# Patient Record
Sex: Male | Born: 1975
Health system: Southern US, Community
[De-identification: ages and names within clinical notes are randomized; demographics above are authoritative.]

## PROBLEM LIST (undated history)

## (undated) DIAGNOSIS — G35 Multiple sclerosis: Secondary | ICD-10-CM

## (undated) DIAGNOSIS — E669 Obesity, unspecified: Secondary | ICD-10-CM

## (undated) HISTORY — DX: Obesity, unspecified: E66.9

---

## 2011-06-17 ENCOUNTER — Encounter: Payer: Self-pay | Admitting: Family Medicine

## 2016-12-16 DIAGNOSIS — Z0000 Encounter for general adult medical examination without abnormal findings: Secondary | ICD-10-CM | POA: Diagnosis not present

## 2016-12-16 DIAGNOSIS — Z13228 Encounter for screening for other metabolic disorders: Secondary | ICD-10-CM | POA: Diagnosis not present

## 2016-12-16 DIAGNOSIS — Z79899 Other long term (current) drug therapy: Secondary | ICD-10-CM | POA: Diagnosis not present

## 2016-12-16 DIAGNOSIS — Z23 Encounter for immunization: Secondary | ICD-10-CM | POA: Diagnosis not present

## 2016-12-16 DIAGNOSIS — Z13220 Encounter for screening for lipoid disorders: Secondary | ICD-10-CM | POA: Diagnosis not present

## 2016-12-26 DIAGNOSIS — Z3009 Encounter for other general counseling and advice on contraception: Secondary | ICD-10-CM | POA: Diagnosis not present

## 2016-12-26 DIAGNOSIS — Z302 Encounter for sterilization: Secondary | ICD-10-CM | POA: Diagnosis not present

## 2017-01-02 DIAGNOSIS — Z9889 Other specified postprocedural states: Secondary | ICD-10-CM | POA: Diagnosis not present

## 2017-06-15 DIAGNOSIS — E559 Vitamin D deficiency, unspecified: Secondary | ICD-10-CM | POA: Diagnosis not present

## 2017-06-15 DIAGNOSIS — R5383 Other fatigue: Secondary | ICD-10-CM | POA: Diagnosis not present

## 2017-06-15 DIAGNOSIS — G35 Multiple sclerosis: Secondary | ICD-10-CM | POA: Diagnosis not present

## 2017-08-09 DIAGNOSIS — G35 Multiple sclerosis: Secondary | ICD-10-CM | POA: Diagnosis not present

## 2017-11-16 DIAGNOSIS — F064 Anxiety disorder due to known physiological condition: Secondary | ICD-10-CM | POA: Diagnosis not present

## 2017-11-20 DIAGNOSIS — N529 Male erectile dysfunction, unspecified: Secondary | ICD-10-CM | POA: Diagnosis not present

## 2017-12-05 DIAGNOSIS — F064 Anxiety disorder due to known physiological condition: Secondary | ICD-10-CM | POA: Diagnosis not present

## 2018-01-11 DIAGNOSIS — F4323 Adjustment disorder with mixed anxiety and depressed mood: Secondary | ICD-10-CM | POA: Diagnosis not present

## 2018-01-18 DIAGNOSIS — F4323 Adjustment disorder with mixed anxiety and depressed mood: Secondary | ICD-10-CM | POA: Diagnosis not present

## 2018-03-08 DIAGNOSIS — F4323 Adjustment disorder with mixed anxiety and depressed mood: Secondary | ICD-10-CM | POA: Diagnosis not present

## 2018-07-19 DIAGNOSIS — G35 Multiple sclerosis: Secondary | ICD-10-CM | POA: Diagnosis not present

## 2018-07-19 DIAGNOSIS — R5383 Other fatigue: Secondary | ICD-10-CM | POA: Diagnosis not present

## 2018-07-19 DIAGNOSIS — E559 Vitamin D deficiency, unspecified: Secondary | ICD-10-CM | POA: Diagnosis not present

## 2018-07-19 DIAGNOSIS — R4189 Other symptoms and signs involving cognitive functions and awareness: Secondary | ICD-10-CM | POA: Diagnosis not present

## 2018-07-20 DIAGNOSIS — G35 Multiple sclerosis: Secondary | ICD-10-CM | POA: Diagnosis not present

## 2018-07-20 DIAGNOSIS — Z6839 Body mass index (BMI) 39.0-39.9, adult: Secondary | ICD-10-CM | POA: Diagnosis not present

## 2018-08-08 DIAGNOSIS — G35 Multiple sclerosis: Secondary | ICD-10-CM | POA: Diagnosis not present

## 2018-08-31 DIAGNOSIS — G35 Multiple sclerosis: Secondary | ICD-10-CM | POA: Diagnosis not present

## 2018-08-31 DIAGNOSIS — E559 Vitamin D deficiency, unspecified: Secondary | ICD-10-CM | POA: Diagnosis not present

## 2018-08-31 DIAGNOSIS — R209 Unspecified disturbances of skin sensation: Secondary | ICD-10-CM | POA: Diagnosis not present

## 2018-08-31 DIAGNOSIS — M25521 Pain in right elbow: Secondary | ICD-10-CM | POA: Diagnosis not present

## 2018-09-17 DIAGNOSIS — G5781 Other specified mononeuropathies of right lower limb: Secondary | ICD-10-CM | POA: Diagnosis not present

## 2018-09-17 DIAGNOSIS — M79671 Pain in right foot: Secondary | ICD-10-CM | POA: Diagnosis not present

## 2018-10-11 DIAGNOSIS — M7711 Lateral epicondylitis, right elbow: Secondary | ICD-10-CM | POA: Diagnosis not present

## 2018-10-25 DIAGNOSIS — Z125 Encounter for screening for malignant neoplasm of prostate: Secondary | ICD-10-CM | POA: Diagnosis not present

## 2018-10-25 DIAGNOSIS — Z113 Encounter for screening for infections with a predominantly sexual mode of transmission: Secondary | ICD-10-CM | POA: Diagnosis not present

## 2018-10-25 DIAGNOSIS — Z136 Encounter for screening for cardiovascular disorders: Secondary | ICD-10-CM | POA: Diagnosis not present

## 2018-10-25 DIAGNOSIS — Z0000 Encounter for general adult medical examination without abnormal findings: Secondary | ICD-10-CM | POA: Diagnosis not present

## 2018-10-31 DIAGNOSIS — N529 Male erectile dysfunction, unspecified: Secondary | ICD-10-CM | POA: Diagnosis not present

## 2018-12-18 DIAGNOSIS — L309 Dermatitis, unspecified: Secondary | ICD-10-CM | POA: Diagnosis not present

## 2018-12-18 DIAGNOSIS — L508 Other urticaria: Secondary | ICD-10-CM | POA: Diagnosis not present

## 2019-09-10 DIAGNOSIS — G35 Multiple sclerosis: Secondary | ICD-10-CM | POA: Diagnosis not present

## 2019-09-10 DIAGNOSIS — Z125 Encounter for screening for malignant neoplasm of prostate: Secondary | ICD-10-CM | POA: Diagnosis not present

## 2019-09-10 DIAGNOSIS — Z113 Encounter for screening for infections with a predominantly sexual mode of transmission: Secondary | ICD-10-CM | POA: Diagnosis not present

## 2019-09-11 DIAGNOSIS — G35 Multiple sclerosis: Secondary | ICD-10-CM | POA: Diagnosis not present

## 2019-09-23 DIAGNOSIS — G35 Multiple sclerosis: Secondary | ICD-10-CM | POA: Diagnosis not present

## 2019-10-04 DIAGNOSIS — G35 Multiple sclerosis: Secondary | ICD-10-CM | POA: Diagnosis not present

## 2019-11-04 DIAGNOSIS — N529 Male erectile dysfunction, unspecified: Secondary | ICD-10-CM | POA: Diagnosis not present

## 2019-12-29 ENCOUNTER — Emergency Department (HOSPITAL_COMMUNITY)
Admission: EM | Admit: 2019-12-29 | Discharge: 2019-12-29 | Disposition: A | Discharge status: Home / Self Care | Payer: BC Managed Care – PPO | Attending: Emergency Medicine | Admitting: Emergency Medicine

## 2019-12-29 ENCOUNTER — Encounter (HOSPITAL_COMMUNITY): Payer: Self-pay | Admitting: Emergency Medicine

## 2019-12-29 ENCOUNTER — Other Ambulatory Visit: Payer: Self-pay

## 2019-12-29 DIAGNOSIS — G35 Multiple sclerosis: Secondary | ICD-10-CM | POA: Diagnosis not present

## 2019-12-29 DIAGNOSIS — L0291 Cutaneous abscess, unspecified: Secondary | ICD-10-CM

## 2019-12-29 DIAGNOSIS — L02415 Cutaneous abscess of right lower limb: Secondary | ICD-10-CM | POA: Insufficient documentation

## 2019-12-29 DIAGNOSIS — L989 Disorder of the skin and subcutaneous tissue, unspecified: Secondary | ICD-10-CM | POA: Diagnosis not present

## 2019-12-29 DIAGNOSIS — F17290 Nicotine dependence, other tobacco product, uncomplicated: Secondary | ICD-10-CM | POA: Insufficient documentation

## 2019-12-29 HISTORY — DX: Multiple sclerosis: G35

## 2019-12-29 MED ORDER — LIDOCAINE HCL (PF) 1 % IJ SOLN
5.0000 mL | Freq: Once | INTRAMUSCULAR | Status: AC
  Administered 2019-12-29: 5 mL
  Filled 2019-12-29: qty 5

## 2019-12-29 MED ORDER — SULFAMETHOXAZOLE-TRIMETHOPRIM 800-160 MG PO TABS: 1.0000 | ORAL_TABLET | Freq: Two times a day (BID) | ORAL | 0 refills | Status: AC

## 2019-12-29 NOTE — Discharge Instructions (Signed)
Take antibiotics as prescribed and complete the full course. Apply warm compresses to your leg for 20 minutes at a time at least 3 times daily. Recommend recheck in 2 days for packing removal.  This can be done at your PCP office, urgent care, or return to ER if needed.

## 2019-12-29 NOTE — ED Provider Notes (Signed)
[facility] EMERGENCY DEPARTMENT Provider Note   CSN: [id number] Arrival date & time: 12/29/19  1336     History Chief Complaint  Patient presents with  . Insect Bite    Alejandro Crawford is a 44 y.o. male.  44 year old male with past medical history of MS presents with complaint of right posterior lower leg wound.  Patient states that he was walking near a pond 1 week ago, did not feel anything bite his leg but thinks he may have been bitten by something as he noticed a bump on the back of his leg afterwards.  Patient tried to squeeze this area without any drainage, reports worsening pain, swelling, redness in the area since that time with drainage.  No other complaints or concerns today.        Past Medical History:  Diagnosis Date  . MS (multiple sclerosis) ([place])   . Obesity   . Vitamin D deficiency     There are no problems to display for this patient.   History reviewed. No pertinent surgical history.     No family history on file.  Social History   Tobacco Use  . Smoking status: Current Every Day Smoker    Types: Cigars  . Smokeless tobacco: Never Used  Substance Use Topics  . Alcohol use: Yes  . Drug use: Never    Home Medications Prior to Admission medications   Medication Sig Start Date End Date Taking? Authorizing Provider  sulfamethoxazole-trimethoprim (BACTRIM DS) 800-160 MG tablet Take 1 tablet by mouth 2 (two) times daily for 10 days. 12/29/19 01/08/20  Tacy Learn, PA-C    Allergies    Patient has no known allergies.  Review of Systems   Review of Systems  Constitutional: Negative for fever.  Musculoskeletal: Positive for myalgias.  Skin: Positive for color change. Negative for rash and wound.  Allergic/Immunologic: Negative for immunocompromised state.  Neurological: Negative for weakness.  Hematological: Negative for adenopathy.  Psychiatric/Behavioral: Negative for confusion.  All other systems reviewed and are  negative.   Physical Exam Updated Vital Signs BP (!) 156/91 (BP Location: Left Wrist)   Pulse 86   Temp 98.7 F (37.1 C) (Oral)   Resp 18   SpO2 100%   Physical Exam Vitals and nursing note reviewed.  Constitutional:      General: He is not in acute distress.    Appearance: He is well-developed. He is not diaphoretic.  HENT:     Head: Normocephalic and atraumatic.  Cardiovascular:     Pulses: Normal pulses.  Pulmonary:     Effort: Pulmonary effort is normal.  Musculoskeletal:     Right lower leg: No edema.     Left lower leg: No edema.       Legs:  Skin:    General: Skin is warm and dry.     Findings: Erythema present.  Neurological:     Mental Status: He is alert and oriented to person, place, and time.  Psychiatric:        Behavior: Behavior normal.     ED Results / Procedures / Treatments   Labs (all labs ordered are listed, but only abnormal results are displayed) Labs Reviewed - No data to display  EKG None  Radiology No results found.  Procedures .Marland KitchenIncision and Drainage  Date/Time: 12/29/2019 3:36 PM Performed by: Tacy Learn, PA-C Authorized by: Tacy Learn, PA-C   Consent:    Consent obtained:  Verbal   Consent given by:  Patient   Risks discussed:  Bleeding, incomplete drainage, pain and damage to other organs   Alternatives discussed:  No treatment Universal protocol:    Procedure explained and questions answered to patient or proxy's satisfaction: yes     Relevant documents present and verified: yes     Test results available and properly labeled: yes     Imaging studies available: yes     Required blood products, implants, devices, and special equipment available: yes     Site/side marked: yes     Immediately prior to procedure a time out was called: yes     Patient identity confirmed:  Verbally with patient Location:    Type:  Abscess   Size:  2cm x 2cm    Location:  Lower extremity   Lower extremity location:  Leg   Leg  location:  R lower leg Pre-procedure details:    Skin preparation:  Betadine Anesthesia (see MAR for exact dosages):    Anesthesia method:  Local infiltration   Local anesthetic:  Lidocaine 1% w/o epi Procedure type:    Complexity:  Complex Procedure details:    Incision types:  Single straight   Incision depth:  Subcutaneous   Scalpel blade:  11   Wound management:  Probed and deloculated, irrigated with saline and extensive cleaning   Drainage:  Purulent   Drainage amount:  Moderate   Wound treatment:  Wound left open   Packing materials:  1/4 in iodoform gauze   Amount 1/4" iodoform:  2" Post-procedure details:    Patient tolerance of procedure:  Tolerated well, no immediate complications   (including critical care time)  Medications Ordered in ED Medications  lidocaine (PF) (XYLOCAINE) 1 % injection 5 mL (5 mLs Infiltration Given by Other 12/29/19 1530)    ED Course  I have reviewed the triage vital signs and the nursing notes.  Pertinent labs & imaging results that were available during my care of the patient were reviewed by me and considered in my medical decision making (see chart for details).  Clinical Course as of Dec 29 1655  Wynelle Link Dec 29, 2019  1656 43yo male with abscess to posterior to right lower leg, I&D in the ER, packing placed and pt dc on antibiotics. Recommend warm compresses, PCP recheck in 2 days for packing removal. Return to ER for new or worsening symptoms.    [LM]    Clinical Course User Index [LM] Alden Hipp   MDM Rules/Calculators/A&P                      Final Clinical Impression(s) / ED Diagnoses Final diagnoses:  Abscess    Rx / DC Orders ED Discharge Orders         Ordered    sulfamethoxazole-trimethoprim (BACTRIM DS) 800-160 MG tablet  2 times daily     12/29/19 1509           Jeannie Fend, PA-C 12/29/19 1657    Lorre Nick, MD 12/31/19 [phone]

## 2019-12-29 NOTE — ED Notes (Signed)
Patient verbalizes understanding of discharge instructions. Opportunity for questioning and answers were provided. Armband removed by staff, pt discharged from ED.  

## 2019-12-29 NOTE — ED Triage Notes (Signed)
C/o insect bite to posterior lower R leg since Monday with swelling and drainage.  Reports chills yesterday.

## 2020-01-01 DIAGNOSIS — L02419 Cutaneous abscess of limb, unspecified: Secondary | ICD-10-CM | POA: Diagnosis not present

## 2020-01-14 DIAGNOSIS — L02419 Cutaneous abscess of limb, unspecified: Secondary | ICD-10-CM | POA: Diagnosis not present

## 2020-02-27 ENCOUNTER — Ambulatory Visit: Payer: BC Managed Care – PPO | Attending: Internal Medicine

## 2020-02-27 DIAGNOSIS — Z23 Encounter for immunization: Secondary | ICD-10-CM

## 2020-02-27 NOTE — Progress Notes (Signed)
   Covid-19 Vaccination Clinic  Name:  [name]    MRN: [id number] DOB: August 10, 1976  02/27/2020  Mr. Tedesco was observed post Covid-19 immunization for 15 minutes without incident. He was provided with Vaccine Information Sheet and instruction to access the V-Safe system.   Mr. Barner was instructed to call 911 with any severe reactions post vaccine: Marland Kitchen Difficulty breathing  . Swelling of face and throat  . A fast heartbeat  . A bad rash all over body  . Dizziness and weakness   Immunizations Administered    Name Date Dose VIS Date Route   Pfizer COVID-19 Vaccine 02/27/2020  8:58 AM 0.3 mL 11/01/2019 Intramuscular   Manufacturer: [employer], [employer]   Lot: [id number]   NDC: [address]-7

## 2020-03-23 ENCOUNTER — Ambulatory Visit: Payer: BC Managed Care – PPO | Attending: Internal Medicine

## 2020-03-23 DIAGNOSIS — Z23 Encounter for immunization: Secondary | ICD-10-CM

## 2020-03-23 NOTE — Progress Notes (Signed)
   Covid-19 Vaccination Clinic  Name:  Aemon Koeller    MRN: [id number] DOB: Jul 10, 1976  03/23/2020  Mr. Watkinson was observed post Covid-19 immunization for 15 minutes without incident. He was provided with Vaccine Information Sheet and instruction to access the V-Safe system.   Mr. Trusty was instructed to call 911 with any severe reactions post vaccine: Marland Kitchen Difficulty breathing  . Swelling of face and throat  . A fast heartbeat  . A bad rash all over body  . Dizziness and weakness   Immunizations Administered    Name Date Dose VIS Date Route   Pfizer COVID-19 Vaccine 03/23/2020  2:25 PM 0.3 mL 01/15/2019 Intramuscular   Manufacturer: [employer], [employer]   Lot: [id number]   NDC: [address]-3

## 2020-04-16 DIAGNOSIS — H6092 Unspecified otitis externa, left ear: Secondary | ICD-10-CM | POA: Diagnosis not present

## 2020-08-18 DIAGNOSIS — F4320 Adjustment disorder, unspecified: Secondary | ICD-10-CM | POA: Diagnosis not present

## 2020-08-25 DIAGNOSIS — F4320 Adjustment disorder, unspecified: Secondary | ICD-10-CM | POA: Diagnosis not present

## 2020-09-01 DIAGNOSIS — F4320 Adjustment disorder, unspecified: Secondary | ICD-10-CM | POA: Diagnosis not present

## 2020-09-04 DIAGNOSIS — G35 Multiple sclerosis: Secondary | ICD-10-CM | POA: Diagnosis not present

## 2020-09-04 DIAGNOSIS — R5383 Other fatigue: Secondary | ICD-10-CM | POA: Diagnosis not present

## 2020-09-04 DIAGNOSIS — E538 Deficiency of other specified B group vitamins: Secondary | ICD-10-CM | POA: Diagnosis not present

## 2020-09-04 DIAGNOSIS — E559 Vitamin D deficiency, unspecified: Secondary | ICD-10-CM | POA: Diagnosis not present

## 2020-09-08 DIAGNOSIS — F4320 Adjustment disorder, unspecified: Secondary | ICD-10-CM | POA: Diagnosis not present

## 2020-09-15 DIAGNOSIS — M47812 Spondylosis without myelopathy or radiculopathy, cervical region: Secondary | ICD-10-CM | POA: Diagnosis not present

## 2020-09-15 DIAGNOSIS — Q283 Other malformations of cerebral vessels: Secondary | ICD-10-CM | POA: Diagnosis not present

## 2020-09-15 DIAGNOSIS — M47814 Spondylosis without myelopathy or radiculopathy, thoracic region: Secondary | ICD-10-CM | POA: Diagnosis not present

## 2020-09-15 DIAGNOSIS — F4320 Adjustment disorder, unspecified: Secondary | ICD-10-CM | POA: Diagnosis not present

## 2020-09-15 DIAGNOSIS — R9082 White matter disease, unspecified: Secondary | ICD-10-CM | POA: Diagnosis not present

## 2020-09-15 DIAGNOSIS — G35 Multiple sclerosis: Secondary | ICD-10-CM | POA: Diagnosis not present

## 2020-09-21 DIAGNOSIS — G35 Multiple sclerosis: Secondary | ICD-10-CM | POA: Diagnosis not present

## 2020-09-22 DIAGNOSIS — G35 Multiple sclerosis: Secondary | ICD-10-CM | POA: Diagnosis not present

## 2020-09-23 DIAGNOSIS — G35 Multiple sclerosis: Secondary | ICD-10-CM | POA: Diagnosis not present

## 2020-09-24 DIAGNOSIS — G35 Multiple sclerosis: Secondary | ICD-10-CM | POA: Diagnosis not present

## 2020-09-25 DIAGNOSIS — G35 Multiple sclerosis: Secondary | ICD-10-CM | POA: Diagnosis not present

## 2020-09-29 DIAGNOSIS — F4320 Adjustment disorder, unspecified: Secondary | ICD-10-CM | POA: Diagnosis not present

## 2020-10-06 DIAGNOSIS — F4320 Adjustment disorder, unspecified: Secondary | ICD-10-CM | POA: Diagnosis not present

## 2020-10-13 DIAGNOSIS — F4320 Adjustment disorder, unspecified: Secondary | ICD-10-CM | POA: Diagnosis not present

## 2020-10-20 DIAGNOSIS — F4320 Adjustment disorder, unspecified: Secondary | ICD-10-CM | POA: Diagnosis not present

## 2020-10-30 DIAGNOSIS — Z0000 Encounter for general adult medical examination without abnormal findings: Secondary | ICD-10-CM | POA: Diagnosis not present

## 2020-11-02 DIAGNOSIS — N529 Male erectile dysfunction, unspecified: Secondary | ICD-10-CM | POA: Diagnosis not present

## 2020-11-03 DIAGNOSIS — F4320 Adjustment disorder, unspecified: Secondary | ICD-10-CM | POA: Diagnosis not present

## 2020-11-17 DIAGNOSIS — Z13220 Encounter for screening for lipoid disorders: Secondary | ICD-10-CM | POA: Diagnosis not present

## 2020-11-17 DIAGNOSIS — Z0000 Encounter for general adult medical examination without abnormal findings: Secondary | ICD-10-CM | POA: Diagnosis not present

## 2020-11-17 DIAGNOSIS — G35 Multiple sclerosis: Secondary | ICD-10-CM | POA: Diagnosis not present

## 2020-11-17 DIAGNOSIS — Z113 Encounter for screening for infections with a predominantly sexual mode of transmission: Secondary | ICD-10-CM | POA: Diagnosis not present

## 2020-11-17 DIAGNOSIS — Z125 Encounter for screening for malignant neoplasm of prostate: Secondary | ICD-10-CM | POA: Diagnosis not present

## 2020-11-24 DIAGNOSIS — F4322 Adjustment disorder with anxiety: Secondary | ICD-10-CM | POA: Diagnosis not present

## 2020-12-01 DIAGNOSIS — F4322 Adjustment disorder with anxiety: Secondary | ICD-10-CM | POA: Diagnosis not present

## 2020-12-15 DIAGNOSIS — F4322 Adjustment disorder with anxiety: Secondary | ICD-10-CM | POA: Diagnosis not present

## 2021-01-13 DIAGNOSIS — F411 Generalized anxiety disorder: Secondary | ICD-10-CM | POA: Diagnosis not present

## 2021-02-10 DIAGNOSIS — F411 Generalized anxiety disorder: Secondary | ICD-10-CM | POA: Diagnosis not present

## 2021-03-17 DIAGNOSIS — F411 Generalized anxiety disorder: Secondary | ICD-10-CM | POA: Diagnosis not present

## 2021-04-22 DIAGNOSIS — F411 Generalized anxiety disorder: Secondary | ICD-10-CM | POA: Diagnosis not present

## 2021-05-27 DIAGNOSIS — F411 Generalized anxiety disorder: Secondary | ICD-10-CM | POA: Diagnosis not present

## 2021-07-28 DIAGNOSIS — F411 Generalized anxiety disorder: Secondary | ICD-10-CM | POA: Diagnosis not present

## 2021-08-25 DIAGNOSIS — F411 Generalized anxiety disorder: Secondary | ICD-10-CM | POA: Diagnosis not present

## 2021-10-04 DIAGNOSIS — J029 Acute pharyngitis, unspecified: Secondary | ICD-10-CM | POA: Diagnosis not present

## 2021-11-17 DIAGNOSIS — F4320 Adjustment disorder, unspecified: Secondary | ICD-10-CM | POA: Diagnosis not present

## 2021-11-24 DIAGNOSIS — E559 Vitamin D deficiency, unspecified: Secondary | ICD-10-CM | POA: Diagnosis not present

## 2021-11-24 DIAGNOSIS — G35 Multiple sclerosis: Secondary | ICD-10-CM | POA: Diagnosis not present

## 2021-11-24 DIAGNOSIS — E538 Deficiency of other specified B group vitamins: Secondary | ICD-10-CM | POA: Diagnosis not present

## 2021-12-01 DIAGNOSIS — F4320 Adjustment disorder, unspecified: Secondary | ICD-10-CM | POA: Diagnosis not present

## 2021-12-10 DIAGNOSIS — Z6841 Body Mass Index (BMI) 40.0 and over, adult: Secondary | ICD-10-CM | POA: Diagnosis not present

## 2021-12-10 DIAGNOSIS — Z13220 Encounter for screening for lipoid disorders: Secondary | ICD-10-CM | POA: Diagnosis not present

## 2021-12-10 DIAGNOSIS — G35 Multiple sclerosis: Secondary | ICD-10-CM | POA: Diagnosis not present

## 2021-12-10 DIAGNOSIS — Z0000 Encounter for general adult medical examination without abnormal findings: Secondary | ICD-10-CM | POA: Diagnosis not present

## 2021-12-10 DIAGNOSIS — Z113 Encounter for screening for infections with a predominantly sexual mode of transmission: Secondary | ICD-10-CM | POA: Diagnosis not present

## 2021-12-10 DIAGNOSIS — Z125 Encounter for screening for malignant neoplasm of prostate: Secondary | ICD-10-CM | POA: Diagnosis not present

## 2021-12-13 DIAGNOSIS — G35 Multiple sclerosis: Secondary | ICD-10-CM | POA: Diagnosis not present

## 2021-12-24 DIAGNOSIS — B1920 Unspecified viral hepatitis C without hepatic coma: Secondary | ICD-10-CM | POA: Diagnosis not present

## 2021-12-29 DIAGNOSIS — F4322 Adjustment disorder with anxiety: Secondary | ICD-10-CM | POA: Diagnosis not present

## 2022-01-09 ENCOUNTER — Encounter (HOSPITAL_COMMUNITY): Payer: Self-pay

## 2022-01-09 ENCOUNTER — Other Ambulatory Visit: Payer: Self-pay

## 2022-01-09 ENCOUNTER — Emergency Department (HOSPITAL_COMMUNITY): Payer: BC Managed Care – PPO

## 2022-01-09 ENCOUNTER — Inpatient Hospital Stay (HOSPITAL_COMMUNITY)
Admission: EM | Admit: 2022-01-09 | Discharge: 2022-01-12 | DRG: 418 | Disposition: A | Discharge status: Home / Self Care | Payer: BC Managed Care – PPO | Attending: Surgery | Admitting: Surgery

## 2022-01-09 DIAGNOSIS — I96 Gangrene, not elsewhere classified: Secondary | ICD-10-CM | POA: Diagnosis present

## 2022-01-09 DIAGNOSIS — F17290 Nicotine dependence, other tobacco product, uncomplicated: Secondary | ICD-10-CM | POA: Diagnosis present

## 2022-01-09 DIAGNOSIS — K8010 Calculus of gallbladder with chronic cholecystitis without obstruction: Secondary | ICD-10-CM | POA: Diagnosis present

## 2022-01-09 DIAGNOSIS — K8066 Calculus of gallbladder and bile duct with acute and chronic cholecystitis without obstruction: Secondary | ICD-10-CM | POA: Diagnosis not present

## 2022-01-09 DIAGNOSIS — K760 Fatty (change of) liver, not elsewhere classified: Secondary | ICD-10-CM | POA: Diagnosis not present

## 2022-01-09 DIAGNOSIS — E559 Vitamin D deficiency, unspecified: Secondary | ICD-10-CM | POA: Diagnosis not present

## 2022-01-09 DIAGNOSIS — G35 Multiple sclerosis: Secondary | ICD-10-CM | POA: Diagnosis present

## 2022-01-09 DIAGNOSIS — K810 Acute cholecystitis: Secondary | ICD-10-CM | POA: Diagnosis present

## 2022-01-09 DIAGNOSIS — R1110 Vomiting, unspecified: Secondary | ICD-10-CM | POA: Diagnosis not present

## 2022-01-09 DIAGNOSIS — K660 Peritoneal adhesions (postprocedural) (postinfection): Secondary | ICD-10-CM | POA: Diagnosis present

## 2022-01-09 DIAGNOSIS — K8000 Calculus of gallbladder with acute cholecystitis without obstruction: Secondary | ICD-10-CM | POA: Diagnosis not present

## 2022-01-09 DIAGNOSIS — E669 Obesity, unspecified: Secondary | ICD-10-CM | POA: Diagnosis not present

## 2022-01-09 DIAGNOSIS — K8012 Calculus of gallbladder with acute and chronic cholecystitis without obstruction: Secondary | ICD-10-CM | POA: Diagnosis not present

## 2022-01-09 DIAGNOSIS — K8001 Calculus of gallbladder with acute cholecystitis with obstruction: Secondary | ICD-10-CM | POA: Diagnosis not present

## 2022-01-09 DIAGNOSIS — Z20822 Contact with and (suspected) exposure to covid-19: Secondary | ICD-10-CM | POA: Diagnosis present

## 2022-01-09 DIAGNOSIS — Z6837 Body mass index (BMI) 37.0-37.9, adult: Secondary | ICD-10-CM | POA: Diagnosis not present

## 2022-01-09 DIAGNOSIS — R109 Unspecified abdominal pain: Secondary | ICD-10-CM | POA: Diagnosis not present

## 2022-01-09 LAB — URINALYSIS, ROUTINE W REFLEX MICROSCOPIC
Bilirubin Urine: NEGATIVE
Glucose, UA: NEGATIVE mg/dL
Hgb urine dipstick: NEGATIVE
Ketones, ur: NEGATIVE mg/dL
Leukocytes,Ua: NEGATIVE
Nitrite: NEGATIVE
Protein, ur: NEGATIVE mg/dL
Specific Gravity, Urine: 1.044 — ABNORMAL HIGH (ref 1.005–1.030)
pH: 5 (ref 5.0–8.0)

## 2022-01-09 LAB — RESP PANEL BY RT-PCR (FLU A&B, COVID) ARPGX2
Influenza A by PCR: NEGATIVE
Influenza B by PCR: NEGATIVE
SARS Coronavirus 2 by RT PCR: NEGATIVE

## 2022-01-09 LAB — COMPREHENSIVE METABOLIC PANEL
ALT: 31 U/L (ref 0–44)
AST: 31 U/L (ref 15–41)
Albumin: 4.2 g/dL (ref 3.5–5.0)
Alkaline Phosphatase: 52 U/L (ref 38–126)
Anion gap: 10 (ref 5–15)
BUN: 13 mg/dL (ref 6–20)
CO2: 20 mmol/L — ABNORMAL LOW (ref 22–32)
Calcium: 8.7 mg/dL — ABNORMAL LOW (ref 8.9–10.3)
Chloride: 104 mmol/L (ref 98–111)
Creatinine, Ser: 1 mg/dL (ref 0.61–1.24)
GFR, Estimated: >60 mL/min (ref >60)
Glucose, Bld: 127 mg/dL — ABNORMAL HIGH (ref 70–99)
Potassium: 3.9 mmol/L (ref 3.5–5.1)
Sodium: 134 mmol/L — ABNORMAL LOW (ref 135–145)
Total Bilirubin: 1 mg/dL (ref 0.3–1.2)
Total Protein: 7.8 g/dL (ref 6.5–8.1)

## 2022-01-09 LAB — CBC
HCT: 46.5 % (ref 39.0–52.0)
Hemoglobin: 14.9 g/dL (ref 13.0–17.0)
MCH: 31.2 pg (ref 26.0–34.0)
MCHC: 32 g/dL (ref 30.0–36.0)
MCV: 97.5 fL (ref 80.0–100.0)
Platelets: 182 10*3/uL (ref 150–400)
RBC: 4.77 MIL/uL (ref 4.22–5.81)
RDW: 12 % (ref 11.5–15.5)
WBC: 12.9 10*3/uL — ABNORMAL HIGH (ref 4.0–10.5)
nRBC: 0 % (ref 0.0–0.2)

## 2022-01-09 LAB — LIPASE, BLOOD: Lipase: 32 U/L (ref 11–51)

## 2022-01-09 MED ORDER — FENTANYL CITRATE PF 50 MCG/ML IJ SOSY
50.0000 ug | PREFILLED_SYRINGE | Freq: Once | INTRAMUSCULAR | Status: AC
  Administered 2022-01-09: 50 ug via INTRAVENOUS
  Filled 2022-01-09: qty 1

## 2022-01-09 MED ORDER — SIMETHICONE 80 MG PO CHEW
40.0000 mg | CHEWABLE_TABLET | Freq: Four times a day (QID) | ORAL | Status: DC | PRN
  Filled 2022-01-09: qty 1

## 2022-01-09 MED ORDER — SODIUM CHLORIDE 0.9 % IV BOLUS
1000.0000 mL | Freq: Once | INTRAVENOUS | Status: AC
  Administered 2022-01-09: 1000 mL via INTRAVENOUS

## 2022-01-09 MED ORDER — MORPHINE SULFATE (PF) 4 MG/ML IV SOLN
4.0000 mg | Freq: Once | INTRAVENOUS | Status: AC
  Administered 2022-01-09: 4 mg via INTRAVENOUS
  Filled 2022-01-09: qty 1

## 2022-01-09 MED ORDER — DIPHENHYDRAMINE HCL 50 MG/ML IJ SOLN: 25.0000 mg | Freq: Four times a day (QID) | INTRAMUSCULAR | Status: DC | PRN

## 2022-01-09 MED ORDER — SODIUM CHLORIDE 0.9 % IV SOLN: Freq: Once | INTRAVENOUS | Status: AC

## 2022-01-09 MED ORDER — ONDANSETRON 4 MG PO TBDP
4.0000 mg | ORAL_TABLET | Freq: Four times a day (QID) | ORAL | Status: DC | PRN
Start: 2022-01-09 — End: 2022-01-12

## 2022-01-09 MED ORDER — KCL IN DEXTROSE-NACL 20-5-0.45 MEQ/L-%-% IV SOLN
INTRAVENOUS | Status: DC
  Filled 2022-01-09: qty 1000

## 2022-01-09 MED ORDER — ONDANSETRON HCL 4 MG/2ML IJ SOLN
4.0000 mg | Freq: Once | INTRAMUSCULAR | Status: AC
  Administered 2022-01-09: 4 mg via INTRAVENOUS
  Filled 2022-01-09: qty 2

## 2022-01-09 MED ORDER — MORPHINE SULFATE (PF) 2 MG/ML IV SOLN
2.0000 mg | INTRAVENOUS | Status: DC | PRN
  Administered 2022-01-09 – 2022-01-10 (×5): 2 mg via INTRAVENOUS
  Filled 2022-01-09 (×5): qty 1

## 2022-01-09 MED ORDER — DOCUSATE SODIUM 100 MG PO CAPS
100.0000 mg | ORAL_CAPSULE | Freq: Two times a day (BID) | ORAL | Status: DC
  Administered 2022-01-09 – 2022-01-12 (×6): 100 mg via ORAL
  Filled 2022-01-09 (×6): qty 1

## 2022-01-09 MED ORDER — ONDANSETRON HCL 4 MG/2ML IJ SOLN: 4.0000 mg | Freq: Four times a day (QID) | INTRAMUSCULAR | Status: DC | PRN

## 2022-01-09 MED ORDER — SODIUM CHLORIDE 0.9 % IV SOLN
2.0000 g | INTRAVENOUS | Status: DC
  Administered 2022-01-09 – 2022-01-11 (×3): 2 g via INTRAVENOUS
  Filled 2022-01-09 (×4): qty 20

## 2022-01-09 MED ORDER — IOHEXOL 300 MG/ML  SOLN
100.0000 mL | Freq: Once | INTRAMUSCULAR | Status: AC | PRN
  Administered 2022-01-09: 100 mL via INTRAVENOUS

## 2022-01-09 MED ORDER — DIPHENHYDRAMINE HCL 25 MG PO CAPS
25.0000 mg | ORAL_CAPSULE | Freq: Four times a day (QID) | ORAL | Status: DC | PRN
Start: 2022-01-09 — End: 2022-01-12

## 2022-01-09 MED ORDER — ENOXAPARIN SODIUM 60 MG/0.6ML IJ SOSY
60.0000 mg | PREFILLED_SYRINGE | INTRAMUSCULAR | Status: DC
  Administered 2022-01-10 – 2022-01-12 (×3): 60 mg via SUBCUTANEOUS
  Filled 2022-01-09 (×5): qty 0.6

## 2022-01-09 NOTE — ED Notes (Signed)
Please call his sister with any updates.

## 2022-01-09 NOTE — ED Triage Notes (Signed)
Patient c/o N/V and upper abdominal pain. Patient states that the emesis this AM was black and yellow.

## 2022-01-09 NOTE — ED Provider Notes (Signed)
[facility] DEPT Provider Note   CSN: [id number] Arrival date & time: 01/09/22  1101     History  Chief Complaint  Patient presents with   Abdominal Pain   Emesis    Alejandro Crawford is a 46 y.o. male with a history of multiple sclerosis who presents to the emergency department complaining of upper abdominal pain, nausea and vomiting that started abruptly last night.  He initially thought that it was gas, and tried drinking hot tea, but had no relief.  He reports 4 episodes of emesis and that his emesis this morning was black and yellow.  Last bowel movement was yesterday, and he states that this was small for him, but otherwise normal.  He reports feeling fine leading up until last night.  Abdominal Pain Associated symptoms: nausea and vomiting   Associated symptoms: no chest pain, no chills, no constipation, no cough, no diarrhea, no dysuria, no fever and no shortness of breath   Emesis Associated symptoms: abdominal pain   Associated symptoms: no chills, no cough, no diarrhea and no fever       Home Medications Prior to Admission medications   Not on File      Allergies    Patient has no known allergies.    Review of Systems   Review of Systems  Constitutional:  Negative for chills and fever.  HENT:  Negative for congestion.   Respiratory:  Negative for cough and shortness of breath.   Cardiovascular:  Negative for chest pain.  Gastrointestinal:  Positive for abdominal pain, nausea and vomiting. Negative for blood in stool, constipation and diarrhea.  Genitourinary:  Negative for dysuria and flank pain.  All other systems reviewed and are negative.  Physical Exam Updated Vital Signs BP (!) 166/100 (BP Location: Left Arm)    Pulse 99    Temp 99.3 F (37.4 C) (Oral)    Resp 17    Ht 5\' 11"  (1.803 m)    Wt 122.5 kg    SpO2 100%    BMI 37.66 kg/m  Physical Exam Vitals and nursing note reviewed.  Constitutional:      Appearance: Normal  appearance.  HENT:     Head: Normocephalic and atraumatic.  Eyes:     Conjunctiva/sclera: Conjunctivae normal.  Cardiovascular:     Rate and Rhythm: Normal rate and regular rhythm.  Pulmonary:     Effort: Pulmonary effort is normal. No respiratory distress.     Breath sounds: Normal breath sounds.  Abdominal:     General: There is no distension.     Palpations: Abdomen is soft.     Tenderness: There is generalized abdominal tenderness and tenderness in the epigastric area. There is guarding. There is no right CVA tenderness, left CVA tenderness or rebound.  Skin:    General: Skin is warm and dry.  Neurological:     General: No focal deficit present.     Mental Status: He is alert.    ED Results / Procedures / Treatments   Labs (all labs ordered are listed, but only abnormal results are displayed) Labs Reviewed  COMPREHENSIVE METABOLIC PANEL - Abnormal; Notable for the following components:      Result Value   Sodium 134 (*)    CO2 20 (*)    Glucose, Bld 127 (*)    Calcium 8.7 (*)    All other components within normal limits  CBC - Abnormal; Notable for the following components:   WBC 12.9 (*)  All other components within normal limits  LIPASE, BLOOD  URINALYSIS, ROUTINE W REFLEX MICROSCOPIC    EKG None  Radiology CT ABDOMEN PELVIS W CONTRAST  Result Date: 01/09/2022 CLINICAL DATA:  46 year old male with acute abdominal and pelvic pain with vomiting. EXAM: CT ABDOMEN AND PELVIS WITH CONTRAST TECHNIQUE: Multidetector CT imaging of the abdomen and pelvis was performed using the standard protocol following bolus administration of intravenous contrast. RADIATION DOSE REDUCTION: This exam was performed according to the departmental dose-optimization program which includes automated exposure control, adjustment of the mA and/or kV according to patient size and/or use of iterative reconstruction technique. CONTRAST:  136mL OMNIPAQUE IOHEXOL 300 MG/ML  SOLN COMPARISON:  None.  FINDINGS: Lower chest: No acute abnormality. Hepatobiliary: A 3.5 cm gallstone is noted in the region of the gallbladder neck with moderate pericholecystic fluid/inflammation compatible with acute cholecystitis. Hepatic steatosis is noted No definite CBD for intrahepatic biliary dilatation identified. Pancreas: Unremarkable Spleen: Unremarkable Adrenals/Urinary Tract: The kidneys, adrenal glands and bladder are unremarkable. Stomach/Bowel: Stomach is within normal limits. Appendix appears normal. No evidence of bowel wall thickening, distention, or inflammatory changes. Vascular/Lymphatic: No significant vascular findings are present. No enlarged abdominal or pelvic lymph nodes. Reproductive: Prostate is unremarkable. Other: A trace amount free fluid within the pelvis is noted. There is no evidence of pneumoperitoneum or abscess. Musculoskeletal: No acute or suspicious bony abnormalities are noted. IMPRESSION: 1. 3.5 cm gallstone in the region of the gallbladder neck with moderate pericholecystic fluid/inflammation compatible with acute cholecystitis. No definite biliary dilatation. 2. Hepatic steatosis. Electronically Signed   By: Margarette Canada M.D.   On: 01/09/2022 14:10    Procedures Procedures    Medications Ordered in ED Medications  sodium chloride 0.9 % bolus 1,000 mL (0 mLs Intravenous Stopped 01/09/22 1257)  fentaNYL (SUBLIMAZE) injection 50 mcg (50 mcg Intravenous Given 01/09/22 1200)  ondansetron (ZOFRAN) injection 4 mg (4 mg Intravenous Given 01/09/22 1159)  iohexol (OMNIPAQUE) 300 MG/ML solution 100 mL (100 mLs Intravenous Contrast Given 01/09/22 1324)  0.9 %  sodium chloride infusion ( Intravenous New Bag/Given 01/09/22 1417)  morphine (PF) 4 MG/ML injection 4 mg (4 mg Intravenous Given 01/09/22 1417)    ED Course/ Medical Decision Making/ A&P                           Medical Decision Making Amount and/or Complexity of Data Reviewed Labs: ordered.   This patient presents to the ED for  concern of abdominal pain, this involves an extensive number of treatment options, and is a complaint that carries with it a high risk of complications and morbidity. The emergent differential diagnosis prior to evaluation includes, but is not limited to,  AAA, mesenteric ischemia, appendicitis, diverticulitis, DKA, gastritis, gastroenteritis, nephrolithiasis, pancreatitis, peritonitis, adrenal insufficiency, intestinal ischemia, constipation, UTI, SBO/LBO, splenic rupture, biliary disease, IBD, IBS, PUD, or hepatitis.  Past Medical History / Co-morbidities: Multiple sclerosis  Physical Exam: Physical exam performed. The pertinent findings include: Patient is tachycardic to 106, afebrile. Abdomen is soft with generalized tenderness to palpation, worse in epigastrium, with mild guarding.   Lab Tests: I ordered, and personally interpreted labs.  The pertinent results include:  mild leukocytosis to 12,900. Electrolytes grossly within normal limits. Lipase normal.  Liver function tests and bilirubin normal.    Imaging Studies: I ordered imaging studies including CT abdomen/pelvis. I independently visualized and interpreted imaging which showed 3.5 cm gallstone with acute cholecystitis. I agree with the radiologist interpretation.  Medications: I ordered medication including IVF, analgesics and antiemetics  for abdominal pain and nausea. Reevaluation of the patient after these medicines showed that the patient stayed the same. I have reviewed the patients home medicines and have made adjustments as needed.  Consultations Obtained: I requested consultation with the general surgeon Dr. Marcello Moores,  and discussed lab and imaging findings as well as pertinent plan - they recommend: admission for surgical intervention.    Disposition: After consideration of the diagnostic results and the patients response to treatment, I feel that patient will require admission and inpatient treatment requiring surgery. The  patient appears reasonably stabilized for admission considering the current resources, flow, and capabilities available in the ED at this time, and I doubt any other [facility] requiring further screening and/or treatment in the ED prior to admission.   Final Clinical Impression(s) / ED Diagnoses Final diagnoses:  Calculus of gallbladder with acute cholecystitis and obstruction    Rx / DC Orders ED Discharge Orders     None      Portions of this report may have been transcribed using voice recognition software. Every effort was made to ensure accuracy; however, inadvertent computerized transcription errors may be present.    Estill Cotta 01/09/22 1437    Valarie Merino, MD 01/09/22 1442

## 2022-01-09 NOTE — H&P (Signed)
CC: abd pain  Requesting provider: Dr Francia Greaves  HPI: Alejandro Crawford is an 46 y.o. male who is here for abd pain that started ~9pm last night after eating a couple small slices of pizza.  It started as back pain then progressed to subxyphoid pain, which prompted evaluation in the ED.  Past Medical History:  Diagnosis Date   MS (multiple sclerosis) ([place])    Obesity    Vitamin D deficiency     History reviewed. No pertinent surgical history.  Family History  Problem Relation Age of Onset   Hypertension Mother     Social:  reports that he has been smoking cigars. He has never used smokeless tobacco. He reports current alcohol use. He reports that he does not use drugs.  Allergies: No Known Allergies  Medications:  Avenox (Ifn Beta 1a) 84mcg IM Vit D 1260mcg q wk Tadalafin 10mg  PRN Wegovy 0.25mg  pen   Results for orders placed or performed during the hospital encounter of 01/09/22 (from the past 48 hour(s))  Lipase, blood     Status: None   Collection Time: 01/09/22 11:44 AM  Result Value Ref Range   Lipase 32 11 - 51 U/L    Comment: Performed at [facility], [place] [address], [place], [place] [address]  Comprehensive metabolic panel     Status: Abnormal   Collection Time: 01/09/22 11:44 AM  Result Value Ref Range   Sodium 134 (L) 135 - 145 mmol/L   Potassium 3.9 3.5 - 5.1 mmol/L   Chloride 104 98 - 111 mmol/L   CO2 20 (L) 22 - 32 mmol/L   Glucose, Bld 127 (H) 70 - 99 mg/dL    Comment: Glucose reference range applies only to samples taken after fasting for at least 8 hours.   BUN 13 6 - 20 mg/dL   Creatinine, Ser 1.00 0.61 - 1.24 mg/dL   Calcium 8.7 (L) 8.9 - 10.3 mg/dL   Total Protein 7.8 6.5 - 8.1 g/dL   Albumin 4.2 3.5 - 5.0 g/dL   AST 31 15 - 41 U/L   ALT 31 0 - 44 U/L   Alkaline Phosphatase 52 38 - 126 U/L   Total Bilirubin 1.0 0.3 - 1.2 mg/dL   GFR, Estimated >60 >60 mL/min    Comment: (NOTE) Calculated using the CKD-EPI Creatinine  Equation (2021)    Anion gap 10 5 - 15    Comment: Performed at [facility], [place] [address], [place], [place] [address]  CBC     Status: Abnormal   Collection Time: 01/09/22 11:44 AM  Result Value Ref Range   WBC 12.9 (H) 4.0 - 10.5 K/uL   RBC 4.77 4.22 - 5.81 MIL/uL   Hemoglobin 14.9 13.0 - 17.0 g/dL   HCT 46.5 39.0 - 52.0 %   MCV 97.5 80.0 - 100.0 fL   MCH 31.2 26.0 - 34.0 pg   MCHC 32.0 30.0 - 36.0 g/dL   RDW 12.0 11.5 - 15.5 %   Platelets 182 150 - 400 K/uL   nRBC 0.0 0.0 - 0.2 %    Comment: Performed at [facility], [place] [address], [place], [place] [address]    CT ABDOMEN PELVIS W CONTRAST  Result Date: 01/09/2022 CLINICAL DATA:  46 year old male with acute abdominal and pelvic pain with vomiting. EXAM: CT ABDOMEN AND PELVIS WITH CONTRAST TECHNIQUE: Multidetector CT imaging of the abdomen and pelvis was performed using the standard protocol following bolus administration of  intravenous contrast. RADIATION DOSE REDUCTION: This exam was performed according to the departmental dose-optimization program which includes automated exposure control, adjustment of the mA and/or kV according to patient size and/or use of iterative reconstruction technique. CONTRAST:  127mL OMNIPAQUE IOHEXOL 300 MG/ML  SOLN COMPARISON:  None. FINDINGS: Lower chest: No acute abnormality. Hepatobiliary: A 3.5 cm gallstone is noted in the region of the gallbladder neck with moderate pericholecystic fluid/inflammation compatible with acute cholecystitis. Hepatic steatosis is noted No definite CBD for intrahepatic biliary dilatation identified. Pancreas: Unremarkable Spleen: Unremarkable Adrenals/Urinary Tract: The kidneys, adrenal glands and bladder are unremarkable. Stomach/Bowel: Stomach is within normal limits. Appendix appears normal. No evidence of bowel wall thickening, distention, or inflammatory changes. Vascular/Lymphatic: No significant vascular findings are present. No  enlarged abdominal or pelvic lymph nodes. Reproductive: Prostate is unremarkable. Other: A trace amount free fluid within the pelvis is noted. There is no evidence of pneumoperitoneum or abscess. Musculoskeletal: No acute or suspicious bony abnormalities are noted. IMPRESSION: 1. 3.5 cm gallstone in the region of the gallbladder neck with moderate pericholecystic fluid/inflammation compatible with acute cholecystitis. No definite biliary dilatation. 2. Hepatic steatosis. Electronically Signed   By: Margarette Canada M.D.   On: 01/09/2022 14:10    ROS - all of the below systems have been reviewed with the patient and positives are indicated with bold text General: chills, fever or night sweats Eyes: blurry vision or double vision ENT: epistaxis or sore throat Hematologic/Lymphatic: bleeding problems, blood clots or swollen lymph nodes Endocrine: temperature intolerance or unexpected weight changes Breast: new or changing breast lumps or nipple discharge Resp: cough, shortness of breath, or wheezing CV: chest pain or dyspnea on exertion GI: as per HPI GU: dysuria, trouble voiding, or hematuria Neuro: TIA or stroke symptoms   PE Blood pressure (!) 166/100, pulse 99, temperature 99.3 F (37.4 C), temperature source Oral, resp. rate 17, height 5\' 11"  (1.803 m), weight 122.5 kg, SpO2 100 %. Constitutional: NAD; conversant; no deformities Eyes: Moist conjunctiva; no lid lag; anicteric; PERRL Neck: Trachea midline; no thyromegaly Lungs: Normal respiratory effort CV: RRR GI: Abd TTP RUQ MSK: Normal range of motion of extremities; no clubbing/cyanosis Psychiatric: Appropriate affect; alert and oriented x3  Results for orders placed or performed during the hospital encounter of 01/09/22 (from the past 48 hour(s))  Lipase, blood     Status: None   Collection Time: 01/09/22 11:44 AM  Result Value Ref Range   Lipase 32 11 - 51 U/L    Comment: Performed at [facility], [place] [address], [place], [place] [address]  Comprehensive metabolic panel     Status: Abnormal   Collection Time: 01/09/22 11:44 AM  Result Value Ref Range   Sodium 134 (L) 135 - 145 mmol/L   Potassium 3.9 3.5 - 5.1 mmol/L   Chloride 104 98 - 111 mmol/L   CO2 20 (L) 22 - 32 mmol/L   Glucose, Bld 127 (H) 70 - 99 mg/dL    Comment: Glucose reference range applies only to samples taken after fasting for at least 8 hours.   BUN 13 6 - 20 mg/dL   Creatinine, Ser 1.00 0.61 - 1.24 mg/dL   Calcium 8.7 (L) 8.9 - 10.3 mg/dL   Total Protein 7.8 6.5 - 8.1 g/dL   Albumin 4.2 3.5 - 5.0 g/dL   AST 31 15 - 41 U/L   ALT 31 0 - 44 U/L   Alkaline Phosphatase 52 38 - 126 U/L   Total Bilirubin  1.0 0.3 - 1.2 mg/dL   GFR, Estimated >60 >60 mL/min    Comment: (NOTE) Calculated using the CKD-EPI Creatinine Equation (2021)    Anion gap 10 5 - 15    Comment: Performed at [facility], [place] [address] [place], [place] [address]  CBC     Status: Abnormal   Collection Time: 01/09/22 11:44 AM  Result Value Ref Range   WBC 12.9 (H) 4.0 - 10.5 K/uL   RBC 4.77 4.22 - 5.81 MIL/uL   Hemoglobin 14.9 13.0 - 17.0 g/dL   HCT 46.5 39.0 - 52.0 %   MCV 97.5 80.0 - 100.0 fL   MCH 31.2 26.0 - 34.0 pg   MCHC 32.0 30.0 - 36.0 g/dL   RDW 12.0 11.5 - 15.5 %   Platelets 182 150 - 400 K/uL   nRBC 0.0 0.0 - 0.2 %    Comment: Performed at [facility], [place] [address] [place], [place] [address]    CT ABDOMEN PELVIS W CONTRAST  Result Date: 01/09/2022 CLINICAL DATA:  46 year old male with acute abdominal and pelvic pain with vomiting. EXAM: CT ABDOMEN AND PELVIS WITH CONTRAST TECHNIQUE: Multidetector CT imaging of the abdomen and pelvis was performed using the standard protocol following bolus administration of intravenous contrast. RADIATION DOSE REDUCTION: This exam was performed according to the departmental dose-optimization program which includes automated exposure control, adjustment of the mA and/or  kV according to patient size and/or use of iterative reconstruction technique. CONTRAST:  113mL OMNIPAQUE IOHEXOL 300 MG/ML  SOLN COMPARISON:  None. FINDINGS: Lower chest: No acute abnormality. Hepatobiliary: A 3.5 cm gallstone is noted in the region of the gallbladder neck with moderate pericholecystic fluid/inflammation compatible with acute cholecystitis. Hepatic steatosis is noted No definite CBD for intrahepatic biliary dilatation identified. Pancreas: Unremarkable Spleen: Unremarkable Adrenals/Urinary Tract: The kidneys, adrenal glands and bladder are unremarkable. Stomach/Bowel: Stomach is within normal limits. Appendix appears normal. No evidence of bowel wall thickening, distention, or inflammatory changes. Vascular/Lymphatic: No significant vascular findings are present. No enlarged abdominal or pelvic lymph nodes. Reproductive: Prostate is unremarkable. Other: A trace amount free fluid within the pelvis is noted. There is no evidence of pneumoperitoneum or abscess. Musculoskeletal: No acute or suspicious bony abnormalities are noted. IMPRESSION: 1. 3.5 cm gallstone in the region of the gallbladder neck with moderate pericholecystic fluid/inflammation compatible with acute cholecystitis. No definite biliary dilatation. 2. Hepatic steatosis. Electronically Signed   By: Margarette Canada M.D.   On: 01/09/2022 14:10     A/P: Tysheem Leiman is an 46 y.o. male with acute cholecystitis.  I have recommended admission to the hospital for IV abx and OR in AM.  We briefly discussed lap chole, including recovery times and time needed in the hospital.  All questions were answered.      Rosario Adie, MD  Colorectal and General Surgery [facility]  Total time of evaluation, examination, counseling and implementing medical decisions was 55 mins moderate decision making.

## 2022-01-10 ENCOUNTER — Encounter (HOSPITAL_COMMUNITY): Payer: Self-pay

## 2022-01-10 ENCOUNTER — Other Ambulatory Visit: Payer: Self-pay

## 2022-01-10 ENCOUNTER — Encounter (HOSPITAL_COMMUNITY): Admission: EM | Disposition: A | Discharge status: Home / Self Care | Payer: Self-pay | Source: Home / Self Care

## 2022-01-10 ENCOUNTER — Observation Stay (HOSPITAL_COMMUNITY): Payer: BC Managed Care – PPO | Admitting: Certified Registered Nurse Anesthetist

## 2022-01-10 DIAGNOSIS — K8066 Calculus of gallbladder and bile duct with acute and chronic cholecystitis without obstruction: Secondary | ICD-10-CM | POA: Diagnosis not present

## 2022-01-10 HISTORY — PX: CHOLECYSTECTOMY: SHX55

## 2022-01-10 LAB — CBC
HCT: 42.5 % (ref 39.0–52.0)
Hemoglobin: 14.5 g/dL (ref 13.0–17.0)
MCH: 31.4 pg (ref 26.0–34.0)
MCHC: 34.1 g/dL (ref 30.0–36.0)
MCV: 92 fL (ref 80.0–100.0)
Platelets: 166 10*3/uL (ref 150–400)
RBC: 4.62 MIL/uL (ref 4.22–5.81)
RDW: 12.2 % (ref 11.5–15.5)
WBC: 14.3 10*3/uL — ABNORMAL HIGH (ref 4.0–10.5)
nRBC: 0 % (ref 0.0–0.2)

## 2022-01-10 LAB — COMPREHENSIVE METABOLIC PANEL
ALT: 27 U/L (ref 0–44)
AST: 23 U/L (ref 15–41)
Albumin: 3.7 g/dL (ref 3.5–5.0)
Alkaline Phosphatase: 47 U/L (ref 38–126)
Anion gap: 7 (ref 5–15)
BUN: 10 mg/dL (ref 6–20)
CO2: 24 mmol/L (ref 22–32)
Calcium: 8.5 mg/dL — ABNORMAL LOW (ref 8.9–10.3)
Chloride: 101 mmol/L (ref 98–111)
Creatinine, Ser: 0.94 mg/dL (ref 0.61–1.24)
GFR, Estimated: >60 mL/min (ref >60)
Glucose, Bld: 139 mg/dL — ABNORMAL HIGH (ref 70–99)
Potassium: 4 mmol/L (ref 3.5–5.1)
Sodium: 132 mmol/L — ABNORMAL LOW (ref 135–145)
Total Bilirubin: 1.2 mg/dL (ref 0.3–1.2)
Total Protein: 7.6 g/dL (ref 6.5–8.1)

## 2022-01-10 LAB — HIV ANTIBODY (ROUTINE TESTING W REFLEX): HIV Screen 4th Generation wRfx: NONREACTIVE

## 2022-01-10 SURGERY — LAPAROSCOPIC CHOLECYSTECTOMY
Anesthesia: General | Site: Abdomen

## 2022-01-10 MED ORDER — FENTANYL CITRATE (PF) 100 MCG/2ML IJ SOLN
INTRAMUSCULAR | Status: DC | PRN
  Administered 2022-01-10: 100 ug via INTRAVENOUS
  Administered 2022-01-10 (×3): 50 ug via INTRAVENOUS

## 2022-01-10 MED ORDER — ROCURONIUM BROMIDE 10 MG/ML (PF) SYRINGE
PREFILLED_SYRINGE | INTRAVENOUS | Status: DC | PRN
  Administered 2022-01-10: 40 mg via INTRAVENOUS
  Administered 2022-01-10 (×2): 20 mg via INTRAVENOUS

## 2022-01-10 MED ORDER — PHENYLEPHRINE 40 MCG/ML (10ML) SYRINGE FOR IV PUSH (FOR BLOOD PRESSURE SUPPORT)
PREFILLED_SYRINGE | INTRAVENOUS | Status: AC
  Filled 2022-01-10: qty 10

## 2022-01-10 MED ORDER — ONDANSETRON HCL 4 MG/2ML IJ SOLN
INTRAMUSCULAR | Status: AC
  Filled 2022-01-10: qty 2

## 2022-01-10 MED ORDER — LACTATED RINGERS IV SOLN: INTRAVENOUS | Status: DC

## 2022-01-10 MED ORDER — PROPOFOL 10 MG/ML IV BOLUS
INTRAVENOUS | Status: DC | PRN
  Administered 2022-01-10: 200 mg via INTRAVENOUS

## 2022-01-10 MED ORDER — DEXAMETHASONE SODIUM PHOSPHATE 10 MG/ML IJ SOLN
INTRAMUSCULAR | Status: DC | PRN
  Administered 2022-01-10: 10 mg via INTRAVENOUS

## 2022-01-10 MED ORDER — SODIUM CHLORIDE 0.9 % IR SOLN
Status: DC | PRN
  Administered 2022-01-10: 1000 mL

## 2022-01-10 MED ORDER — MIDAZOLAM HCL 2 MG/2ML IJ SOLN
INTRAMUSCULAR | Status: DC | PRN
  Administered 2022-01-10: 2 mg via INTRAVENOUS

## 2022-01-10 MED ORDER — SUCCINYLCHOLINE CHLORIDE 200 MG/10ML IV SOSY
PREFILLED_SYRINGE | INTRAVENOUS | Status: AC
  Filled 2022-01-10: qty 10

## 2022-01-10 MED ORDER — DEXAMETHASONE SODIUM PHOSPHATE 10 MG/ML IJ SOLN
INTRAMUSCULAR | Status: AC
  Filled 2022-01-10: qty 1

## 2022-01-10 MED ORDER — ONDANSETRON HCL 4 MG/2ML IJ SOLN
INTRAMUSCULAR | Status: DC | PRN
Start: 2022-01-10 — End: 2022-01-10
  Administered 2022-01-10: 4 mg via INTRAVENOUS

## 2022-01-10 MED ORDER — HYDRALAZINE HCL 20 MG/ML IJ SOLN: 10.0000 mg | Freq: Three times a day (TID) | INTRAMUSCULAR | Status: DC | PRN

## 2022-01-10 MED ORDER — ACETAMINOPHEN 500 MG PO TABS
1000.0000 mg | ORAL_TABLET | Freq: Four times a day (QID) | ORAL | Status: DC | PRN
  Administered 2022-01-11 – 2022-01-12 (×2): 1000 mg via ORAL
  Filled 2022-01-10 (×2): qty 2

## 2022-01-10 MED ORDER — LIDOCAINE 2% (20 MG/ML) 5 ML SYRINGE
INTRAMUSCULAR | Status: DC | PRN
  Administered 2022-01-10: 100 mg via INTRAVENOUS

## 2022-01-10 MED ORDER — HYDROMORPHONE HCL 1 MG/ML IJ SOLN: 0.2500 mg | INTRAMUSCULAR | Status: DC | PRN

## 2022-01-10 MED ORDER — MIDAZOLAM HCL 2 MG/2ML IJ SOLN
INTRAMUSCULAR | Status: AC
  Filled 2022-01-10: qty 2

## 2022-01-10 MED ORDER — METHOCARBAMOL 500 MG PO TABS: 500.0000 mg | ORAL_TABLET | Freq: Four times a day (QID) | ORAL | Status: DC | PRN

## 2022-01-10 MED ORDER — SUGAMMADEX SODIUM 500 MG/5ML IV SOLN
INTRAVENOUS | Status: AC
  Filled 2022-01-10: qty 5

## 2022-01-10 MED ORDER — CHLORHEXIDINE GLUCONATE 0.12 % MT SOLN
15.0000 mL | Freq: Once | OROMUCOSAL | Status: AC
  Administered 2022-01-10: 15 mL via OROMUCOSAL

## 2022-01-10 MED ORDER — FENTANYL CITRATE (PF) 250 MCG/5ML IJ SOLN
INTRAMUSCULAR | Status: AC
  Filled 2022-01-10: qty 5

## 2022-01-10 MED ORDER — BUPIVACAINE-EPINEPHRINE 0.25% -1:200000 IJ SOLN
INTRAMUSCULAR | Status: DC | PRN
  Administered 2022-01-10: 15 mL

## 2022-01-10 MED ORDER — MORPHINE SULFATE (PF) 2 MG/ML IV SOLN
2.0000 mg | INTRAVENOUS | Status: DC | PRN
  Administered 2022-01-10 (×3): 2 mg via INTRAVENOUS
  Filled 2022-01-10 (×3): qty 1

## 2022-01-10 MED ORDER — OXYCODONE HCL 5 MG PO TABS
5.0000 mg | ORAL_TABLET | ORAL | Status: DC | PRN
  Administered 2022-01-10 – 2022-01-11 (×5): 10 mg via ORAL
  Administered 2022-01-12: 5 mg via ORAL
  Administered 2022-01-12: 10 mg via ORAL
  Filled 2022-01-10 (×3): qty 2
  Filled 2022-01-10: qty 1
  Filled 2022-01-10 (×3): qty 2

## 2022-01-10 MED ORDER — BUPIVACAINE-EPINEPHRINE (PF) 0.25% -1:200000 IJ SOLN
INTRAMUSCULAR | Status: AC
  Filled 2022-01-10: qty 30

## 2022-01-10 MED ORDER — ROCURONIUM BROMIDE 10 MG/ML (PF) SYRINGE
PREFILLED_SYRINGE | INTRAVENOUS | Status: AC
  Filled 2022-01-10: qty 10

## 2022-01-10 MED ORDER — POLYETHYLENE GLYCOL 3350 17 G PO PACK: 17.0000 g | PACK | Freq: Every day | ORAL | Status: DC | PRN

## 2022-01-10 MED ORDER — PHENYLEPHRINE 40 MCG/ML (10ML) SYRINGE FOR IV PUSH (FOR BLOOD PRESSURE SUPPORT)
PREFILLED_SYRINGE | INTRAVENOUS | Status: DC | PRN
  Administered 2022-01-10: 80 ug via INTRAVENOUS
  Administered 2022-01-10: 120 ug via INTRAVENOUS

## 2022-01-10 MED ORDER — SUGAMMADEX SODIUM 200 MG/2ML IV SOLN
INTRAVENOUS | Status: DC | PRN
  Administered 2022-01-10: 300 mg via INTRAVENOUS

## 2022-01-10 MED ORDER — SODIUM CHLORIDE 0.9 % IV SOLN: INTRAVENOUS | Status: DC

## 2022-01-10 MED ORDER — LIDOCAINE HCL (PF) 2 % IJ SOLN
INTRAMUSCULAR | Status: AC
  Filled 2022-01-10: qty 5

## 2022-01-10 MED ORDER — SUCCINYLCHOLINE CHLORIDE 200 MG/10ML IV SOSY
PREFILLED_SYRINGE | INTRAVENOUS | Status: DC | PRN
  Administered 2022-01-10: 140 mg via INTRAVENOUS

## 2022-01-10 SURGICAL SUPPLY — 44 items
APPLIER CLIP ROT 10 11.4 M/L (STAPLE) ×3
BAG COUNTER SPONGE SURGICOUNT (BAG) IMPLANT
BENZOIN TINCTURE PRP APPL 2/3 (GAUZE/BANDAGES/DRESSINGS) ×2 IMPLANT
CABLE HIGH FREQUENCY MONO STRZ (ELECTRODE) ×3 IMPLANT
CHLORAPREP W/TINT 26 (MISCELLANEOUS) ×3 IMPLANT
CLIP APPLIE ROT 10 11.4 M/L (STAPLE) ×2 IMPLANT
COVER MAYO STAND STRL (DRAPES) IMPLANT
COVER SURGICAL LIGHT HANDLE (MISCELLANEOUS) ×3 IMPLANT
DERMABOND ADVANCED (GAUZE/BANDAGES/DRESSINGS) ×1
DERMABOND ADVANCED .7 DNX12 (GAUZE/BANDAGES/DRESSINGS) ×2 IMPLANT
DRAIN CHANNEL RND F F (WOUND CARE) ×2 IMPLANT
DRAPE C-ARM 42X120 X-RAY (DRAPES) IMPLANT
DRSG TEGADERM 4X4.75 (GAUZE/BANDAGES/DRESSINGS) ×2 IMPLANT
ELECT REM PT RETURN 15FT ADLT (MISCELLANEOUS) ×3 IMPLANT
EVACUATOR SILICONE 100CC (DRAIN) ×2 IMPLANT
GLOVE SURG ENC MOIS LTX SZ6 (GLOVE) ×3 IMPLANT
GLOVE SURG MICRO LTX SZ6 (GLOVE) ×3 IMPLANT
GLOVE SURG UNDER LTX SZ6.5 (GLOVE) ×3 IMPLANT
GOWN STRL REUS W/TWL LRG LVL3 (GOWN DISPOSABLE) ×3 IMPLANT
GOWN STRL REUS W/TWL XL LVL3 (GOWN DISPOSABLE) ×6 IMPLANT
GRASPER SUT TROCAR 14GX15 (MISCELLANEOUS) IMPLANT
HEMOSTAT SNOW SURGICEL 2X4 (HEMOSTASIS) IMPLANT
IRRIG SUCT STRYKERFLOW 2 WTIP (MISCELLANEOUS) ×3
IRRIGATION SUCT STRKRFLW 2 WTP (MISCELLANEOUS) ×2 IMPLANT
KIT BASIN OR (CUSTOM PROCEDURE TRAY) ×3 IMPLANT
KIT TURNOVER KIT A (KITS) IMPLANT
NDL INSUFFLATION 14GA 120MM (NEEDLE) ×1 IMPLANT
NEEDLE INSUFFLATION 14GA 120MM (NEEDLE) ×3 IMPLANT
PENCIL SMOKE EVACUATOR (MISCELLANEOUS) IMPLANT
POUCH SPECIMEN RETRIEVAL 10MM (ENDOMECHANICALS) ×3 IMPLANT
SCISSORS LAP 5X35 DISP (ENDOMECHANICALS) ×3 IMPLANT
SET CHOLANGIOGRAPH MIX (MISCELLANEOUS) IMPLANT
SET TUBE SMOKE EVAC HIGH FLOW (TUBING) ×3 IMPLANT
SLEEVE XCEL OPT CAN 5 100 (ENDOMECHANICALS) ×6 IMPLANT
SPIKE FLUID TRANSFER (MISCELLANEOUS) ×3 IMPLANT
SPONGE DRAIN TRACH 4X4 STRL 2S (GAUZE/BANDAGES/DRESSINGS) ×2 IMPLANT
STRIP CLOSURE SKIN 1/2X4 (GAUZE/BANDAGES/DRESSINGS) ×2 IMPLANT
SUT ETHILON 2 0 PS N (SUTURE) ×2 IMPLANT
SUT MNCRL AB 4-0 PS2 18 (SUTURE) ×3 IMPLANT
TOWEL OR 17X26 10 PK STRL BLUE (TOWEL DISPOSABLE) ×3 IMPLANT
TOWEL OR NON WOVEN STRL DISP B (DISPOSABLE) IMPLANT
TRAY LAPAROSCOPIC (CUSTOM PROCEDURE TRAY) ×3 IMPLANT
TROCAR BLADELESS OPT 5 100 (ENDOMECHANICALS) ×3 IMPLANT
TROCAR XCEL 12X100 BLDLESS (ENDOMECHANICALS) ×3 IMPLANT

## 2022-01-10 NOTE — Op Note (Signed)
Operative Note  Amato Sevillano 46 y.o. male [id number]  01/10/2022  Surgeon: Berna Bue MD FACS  Assistant: Hosie Spangle PA-C  Procedure performed: Laparoscopic subtotal fenestrating cholecystectomy  Procedure classification: Emergent  Preop diagnosis: Acute cholecystitis Post-op diagnosis/intraop findings: same, severe, gangrenous Specimens: gallbladder  Retained items: 19 French round Blake drain directed into the gallbladder fossa secured in the right upper quadrant with a 2-0 nylon  EBL: 30cc  Complications: none  Description of procedure: After obtaining informed consent the patient was brought to the operating room.  He is on standing antibiotics. SCD's were applied. General endotracheal anesthesia was initiated and a formal time-out was performed. The abdomen was prepped and draped in the usual sterile fashion and the abdomen was entered using an infraumbilical veress needle after instilling the site with local. Insufflation to was obtained, 32mm trocar and camera inserted, and gross inspection revealed no evidence of injury from our entry or other intraabdominal abnormalities. Two 3mm trocars were introduced in the right midclavicular and right anterior axillary lines under direct visualization and following infiltration with local. An 31mm trocar was placed in the epigastrium.  The gallbladder was encased in dense omental adhesions as well as early adhesions to the duodenum and colon which were carefully taken down with blunt dissection until the fundus could be identified and retracted cephalad, ensuring no injury to the bowel.  There was a dense inflammatory rind surrounding the gallbladder and this was especially intense at the level of the hepatocystic triangle and porta; despite continued gentle dissection the infundibulum was unable to be sufficiently visualized to dissect down to the cystic duct.  A large stone was palpable in the gallbladder neck. At this point  we initiated a dome down approach, carefully dissecting the gallbladder from the liver bed until approximately just proximal to the infundibulum where no further progress could be made.  The cystic artery was able to be identified and this was clipped before being divided with cautery.  The gallbladder was resected with cautery, and bile as well as sludge were aspirated from the gallbladder.  We were then able to milk the rather large stone, likely about 3.5 cm in maximal diameter, out of the gallbladder neck.  The stone and gallbladder wall were placed in an Endo Catch bag and removed through the epigastric trocar site. The remaining gallbladder mucosa was examined, we were unable to see the orifice of the cystic duct, but no active bile leakage was present at this time.  The liver bed and gallbladder mucosa were ablated with cautery. The omentum was then brought up and placed in the gallbladder fossa.  A 19 French round like drain was inserted through the right lateral trocar site and directed over the liver and into the gallbladder fossa.  This is secured to the skin with a 2-0 nylon. Skin incisions were closed with subcuticular 4-0 Monocryl and Dermabond. The patient was awakened, extubated and transported to the recovery room in stable condition.    All counts were correct at the completion of the case.

## 2022-01-10 NOTE — ED Notes (Signed)
Pt care taken, pt resting no complaints

## 2022-01-10 NOTE — Plan of Care (Signed)

## 2022-01-10 NOTE — Anesthesia Postprocedure Evaluation (Signed)
Anesthesia Post Note  Patient: Alejandro Crawford  Procedure(s) Performed: LAPAROSCOPIC CHOLECYSTECTOMY (Abdomen)     Patient location during evaluation: PACU Anesthesia Type: General Level of consciousness: awake Pain management: pain level controlled Vital Signs Assessment: post-procedure vital signs reviewed and stable Cardiovascular status: stable Postop Assessment: no apparent nausea or vomiting Anesthetic complications: no   No notable events documented.  Last Vitals:  Vitals:   01/10/22 1256 01/10/22 1406  BP: (!) 133/92 (!) 143/98  Pulse: 98 97  Resp: 18 18  Temp: 37 C 37.2 C  SpO2: 94% 97%    Last Pain:  Vitals:   01/10/22 1329  TempSrc:   PainSc: 7                  [name]

## 2022-01-10 NOTE — Interval H&P Note (Signed)
History and Physical Interval Note:  01/10/2022 7:51 AM  Alejandro Crawford  has presented today for surgery, with the diagnosis of acute cholecystitis.  The various methods of treatment have been discussed with the patient and family. After consideration of risks, benefits and other options for treatment, the patient has consented to  Procedure(s): LAPAROSCOPIC CHOLECYSTECTOMY WITH possibleINTRAOPERATIVE CHOLANGIOGRAM (N/A) as a surgical intervention.  The patient's history has been reviewed, patient examined, no change in status, stable for surgery.  I have reviewed the patient's chart and labs.  Questions were answered to the patient's satisfaction.     [name] Lollie Sails

## 2022-01-10 NOTE — Transfer of Care (Signed)
Immediate Anesthesia Transfer of Care Note  Patient: Alejandro Crawford  Procedure(s) Performed: LAPAROSCOPIC CHOLECYSTECTOMY (Abdomen)  Patient Location: PACU  Anesthesia Type:General  Level of Consciousness: awake, alert  and oriented  Airway & Oxygen Therapy: Patient Spontanous Breathing and Patient connected to face mask oxygen  Post-op Assessment: Report given to RN and Post -op Vital signs reviewed and stable  Post vital signs: Reviewed and stable  Last Vitals:  Vitals Value Taken Time  BP 169/111 01/10/22 1115  Temp    Pulse 93 01/10/22 1116  Resp 21 01/10/22 1116  SpO2 99 % 01/10/22 1116  Vitals shown include unvalidated device data.  Last Pain:  Vitals:   01/10/22 0807  TempSrc:   PainSc: 7       Patients Stated Pain Goal: 6 (01/10/22 0807)  Complications: No notable events documented.

## 2022-01-10 NOTE — Anesthesia Preprocedure Evaluation (Signed)
Anesthesia Evaluation  Patient identified by MRN, date of birth, ID band Patient awake    Reviewed: Allergy & Precautions, NPO status , Patient's Chart, lab work & pertinent test results  Airway Mallampati: II  TM Distance: >3 FB     Dental   Pulmonary neg pulmonary ROS, Current Smoker and Patient abstained from smoking.,    breath sounds clear to auscultation       Cardiovascular negative cardio ROS   Rhythm:Regular Rate:Normal     Neuro/Psych    GI/Hepatic negative GI ROS, Neg liver ROS,   Endo/Other  negative endocrine ROS  Renal/GU negative Renal ROS     Musculoskeletal   Abdominal   Peds  Hematology   Anesthesia Other Findings   Reproductive/Obstetrics                             Anesthesia Physical Anesthesia Plan  ASA: 3  Anesthesia Plan: General   Post-op Pain Management:    Induction: Intravenous  PONV Risk Score and Plan: 3 and Ondansetron, Dexamethasone and Midazolam  Airway Management Planned: Oral ETT  Additional Equipment:   Intra-op Plan:   Post-operative Plan: Extubation in OR  Informed Consent: I have reviewed the patients History and Physical, chart, labs and discussed the procedure including the risks, benefits and alternatives for the proposed anesthesia with the patient or authorized representative who has indicated his/her understanding and acceptance.     Dental advisory given  Plan Discussed with: CRNA and Anesthesiologist  Anesthesia Plan Comments:         Anesthesia Quick Evaluation

## 2022-01-10 NOTE — Anesthesia Procedure Notes (Signed)
Procedure Name: Intubation Date/Time: 01/10/2022 9:05 AM Performed by: Pearson Grippe, CRNA Pre-anesthesia Checklist: Patient identified, Emergency Drugs available, Suction available and Patient being monitored Patient Re-evaluated:Patient Re-evaluated prior to induction Oxygen Delivery Method: Circle system utilized Preoxygenation: Pre-oxygenation with 100% oxygen Induction Type: IV induction, Rapid sequence and Cricoid Pressure applied Laryngoscope Size: Miller and 2 Grade View: Grade I Tube type: Oral Tube size: 7.5 mm Number of attempts: 1 Airway Equipment and Method: Stylet Placement Confirmation: ETT inserted through vocal cords under direct vision, positive ETCO2 and breath sounds checked- equal and bilateral Secured at: 22 cm Tube secured with: Tape Dental Injury: Teeth and Oropharynx as per pre-operative assessment

## 2022-01-10 NOTE — Anesthesia Postprocedure Evaluation (Signed)
Anesthesia Post Note  Patient: Financial planner  Procedure(s) Performed: LAPAROSCOPIC CHOLECYSTECTOMY (Abdomen)     Patient location during evaluation: PACU Anesthesia Type: General Level of consciousness: awake Pain management: pain level controlled Respiratory status: spontaneous breathing Cardiovascular status: stable Postop Assessment: no apparent nausea or vomiting Anesthetic complications: no   No notable events documented.  Last Vitals:  Vitals:   01/10/22 0802 01/10/22 1115  BP: (!) 149/88 (!) 146/88  Pulse: (!) 101 93  Resp: 18 (!) 21  Temp: 37.2 C 37.3 C  SpO2: 95% 99%    Last Pain:  Vitals:   01/10/22 1115  TempSrc:   PainSc: Asleep                 Keoshia Steinmetz

## 2022-01-11 ENCOUNTER — Encounter (HOSPITAL_COMMUNITY): Payer: Self-pay | Admitting: Surgery

## 2022-01-11 DIAGNOSIS — E669 Obesity, unspecified: Secondary | ICD-10-CM | POA: Diagnosis present

## 2022-01-11 DIAGNOSIS — K8000 Calculus of gallbladder with acute cholecystitis without obstruction: Secondary | ICD-10-CM | POA: Diagnosis present

## 2022-01-11 DIAGNOSIS — Z6837 Body mass index (BMI) 37.0-37.9, adult: Secondary | ICD-10-CM | POA: Diagnosis not present

## 2022-01-11 DIAGNOSIS — Z20822 Contact with and (suspected) exposure to covid-19: Secondary | ICD-10-CM | POA: Diagnosis present

## 2022-01-11 DIAGNOSIS — F17290 Nicotine dependence, other tobacco product, uncomplicated: Secondary | ICD-10-CM | POA: Diagnosis present

## 2022-01-11 DIAGNOSIS — K8010 Calculus of gallbladder with chronic cholecystitis without obstruction: Secondary | ICD-10-CM | POA: Diagnosis present

## 2022-01-11 DIAGNOSIS — I96 Gangrene, not elsewhere classified: Secondary | ICD-10-CM | POA: Diagnosis present

## 2022-01-11 DIAGNOSIS — K8001 Calculus of gallbladder with acute cholecystitis with obstruction: Secondary | ICD-10-CM | POA: Diagnosis present

## 2022-01-11 DIAGNOSIS — G35 Multiple sclerosis: Secondary | ICD-10-CM | POA: Diagnosis present

## 2022-01-11 DIAGNOSIS — K660 Peritoneal adhesions (postprocedural) (postinfection): Secondary | ICD-10-CM | POA: Diagnosis present

## 2022-01-11 DIAGNOSIS — E559 Vitamin D deficiency, unspecified: Secondary | ICD-10-CM | POA: Diagnosis present

## 2022-01-11 LAB — COMPREHENSIVE METABOLIC PANEL
ALT: 41 U/L (ref 0–44)
AST: 32 U/L (ref 15–41)
Albumin: 3.2 g/dL — ABNORMAL LOW (ref 3.5–5.0)
Alkaline Phosphatase: 43 U/L (ref 38–126)
Anion gap: 7 (ref 5–15)
BUN: 14 mg/dL (ref 6–20)
CO2: 23 mmol/L (ref 22–32)
Calcium: 8.2 mg/dL — ABNORMAL LOW (ref 8.9–10.3)
Chloride: 105 mmol/L (ref 98–111)
Creatinine, Ser: 1.11 mg/dL (ref 0.61–1.24)
GFR, Estimated: >60 mL/min (ref >60)
Glucose, Bld: 108 mg/dL — ABNORMAL HIGH (ref 70–99)
Potassium: 4.2 mmol/L (ref 3.5–5.1)
Sodium: 135 mmol/L (ref 135–145)
Total Bilirubin: 0.9 mg/dL (ref 0.3–1.2)
Total Protein: 6.7 g/dL (ref 6.5–8.1)

## 2022-01-11 LAB — CBC
HCT: 39.9 % (ref 39.0–52.0)
Hemoglobin: 13.3 g/dL (ref 13.0–17.0)
MCH: 31.4 pg (ref 26.0–34.0)
MCHC: 33.3 g/dL (ref 30.0–36.0)
MCV: 94.1 fL (ref 80.0–100.0)
Platelets: 164 10*3/uL (ref 150–400)
RBC: 4.24 MIL/uL (ref 4.22–5.81)
RDW: 12.5 % (ref 11.5–15.5)
WBC: 17.5 10*3/uL — ABNORMAL HIGH (ref 4.0–10.5)
nRBC: 0 % (ref 0.0–0.2)

## 2022-01-11 NOTE — Progress Notes (Signed)
°  Transition of Care (TOC) Screening Note   Patient Details  Name: Alejandro Crawford Date of Birth: Dec 15, 1975   Transition of Care [facility]) CM/SW Contact:    Amada Jupiter, LCSW Phone Number: 01/11/2022, 10:58 AM    Transition of Care Department [facility]) has reviewed patient and no TOC needs have been identified at this time. We will continue to monitor patient advancement through interdisciplinary progression rounds. If new patient transition needs arise, please place a TOC consult.

## 2022-01-11 NOTE — Progress Notes (Signed)
[facility] Progress Note  1 Day Post-Op  Subjective: CC:  Abd pain improved compared to pre-op, reports soreness. Mobilizing to bathroom. Denies nausea or vomiting. Tolerating CLD.   Objective: Vital signs in last 24 hours: Temp:  [98.6 F (37 C)-100.1 F (37.8 C)] 99.4 F (37.4 C) (02/21 0518) Pulse Rate:  [88-108] 93 (02/21 0518) Resp:  [14-31] 18 (02/21 0518) BP: (121-149)/(69-98) 122/79 (02/21 0518) SpO2:  [92 %-99 %] 92 % (02/21 0518) Last BM Date :  (prior to admission, pt states it has been a day or two)  Intake/Output from previous day: 02/20 0701 - 02/21 0700 In: 3043.2 [P.O.:120; I.V.:2823.2; IV Piggyback:100] Out: 2415 [Urine:1920; Drains:485; Blood:10] Intake/Output this shift: No intake/output data recorded.  PE: Gen:  Alert, NAD, pleasant Pulm:  Normal effort Abd: Soft, approp tender, incisions c/d/I, RUQ blake drain with SS fluid, no evidence of bile Skin: warm and dry, no rashes  Psych: A&Ox3   Lab Results:  Recent Labs    01/10/22 0423 01/11/22 0419  WBC 14.3* 17.5*  HGB 14.5 13.3  HCT 42.5 39.9  PLT 166 164   BMET Recent Labs    01/10/22 0423 01/11/22 0419  NA 132* 135  K 4.0 4.2  CL 101 105  CO2 24 23  GLUCOSE 139* 108*  BUN 10 14  CREATININE 0.94 1.11  CALCIUM 8.5* 8.2*   PT/INR No results for input(s): LABPROT, INR in the last 72 hours. CMP     Component Value Date/Time   NA 135 01/11/2022 0419   K 4.2 01/11/2022 0419   CL 105 01/11/2022 0419   CO2 23 01/11/2022 0419   GLUCOSE 108 (H) 01/11/2022 0419   BUN 14 01/11/2022 0419   CREATININE 1.11 01/11/2022 0419   CALCIUM 8.2 (L) 01/11/2022 0419   PROT 6.7 01/11/2022 0419   ALBUMIN 3.2 (L) 01/11/2022 0419   AST 32 01/11/2022 0419   ALT 41 01/11/2022 0419   ALKPHOS 43 01/11/2022 0419   BILITOT 0.9 01/11/2022 0419   GFRNONAA >60 01/11/2022 0419   Lipase     Component Value Date/Time   LIPASE 32 01/09/2022 1144   Studies/Results: CT ABDOMEN PELVIS W  CONTRAST  Result Date: 01/09/2022 CLINICAL DATA:  46 year old male with acute abdominal and pelvic pain with vomiting. EXAM: CT ABDOMEN AND PELVIS WITH CONTRAST TECHNIQUE: Multidetector CT imaging of the abdomen and pelvis was performed using the standard protocol following bolus administration of intravenous contrast. RADIATION DOSE REDUCTION: This exam was performed according to the departmental dose-optimization program which includes automated exposure control, adjustment of the mA and/or kV according to patient size and/or use of iterative reconstruction technique. CONTRAST:  OMNIPAQUE IOHEXOL 300 MG/ML  SOLN COMPARISON:  None. FINDINGS: Lower chest: No acute abnormality. Hepatobiliary: A 3.5 cm gallstone is noted in the region of the gallbladder neck with moderate pericholecystic fluid/inflammation compatible with acute cholecystitis. Hepatic steatosis is noted No definite CBD for intrahepatic biliary dilatation identified. Pancreas: Unremarkable Spleen: Unremarkable Adrenals/Urinary Tract: The kidneys, adrenal glands and bladder are unremarkable. Stomach/Bowel: Stomach is within normal limits. Appendix appears normal. No evidence of bowel wall thickening, distention, or inflammatory changes. Vascular/Lymphatic: No significant vascular findings are present. No enlarged abdominal or pelvic lymph nodes. Reproductive: Prostate is unremarkable. Other: A trace amount free fluid within the pelvis is noted. There is no evidence of pneumoperitoneum or abscess. Musculoskeletal: No acute or suspicious bony abnormalities are noted. IMPRESSION: 1. 3.5 cm gallstone in the region of the gallbladder neck with moderate  pericholecystic fluid/inflammation compatible with acute cholecystitis. No definite biliary dilatation. 2. Hepatic steatosis. Electronically Signed   By: Harmon Pier M.D.   On: 01/09/2022 14:10    Anti-infectives: Anti-infectives (From admission, onward)    Start     Dose/Rate Route Frequency  Ordered Stop   01/09/22 1600  cefTRIAXone (ROCEPHIN) 2 g in sodium chloride 0.9 % 100 mL IVPB        2 g 200 mL/hr over 30 Minutes Intravenous Every 24 hours 01/09/22 1537 01/16/22 1559      Assessment/Plan Acute calculous cholecystitis, gangrenous  S/P laparoscopic subtotal fenestrating cholecystectomy 01/10/22  POD#1 - AFVSS, WBC 17.5 from 14.3, LFTs WNL - blake drain with SS fluid  - advance diet to heart healthy  - patient may be able to discharge home this afternoon with drain in place if he tolerates solids ok.      LOS: 0 days   I reviewed nursing notes, last 24 h vitals and pain scores, last 48 h intake and output, last 24 h labs and trends, and last 24 h imaging results.  This care required low level of medical decision making.   Hosie Spangle, PA-C [facility] Please see Amion for pager number during day hours 7:00am-4:30pm

## 2022-01-11 NOTE — Discharge Instructions (Signed)
[facility] LAPAROSCOPIC SURGERY: POST OP INSTRUCTIONS Always review your discharge instruction sheet given to you by the facility where your surgery was performed. IF YOU HAVE DISABILITY OR FAMILY LEAVE FORMS, YOU MUST BRING THEM TO THE OFFICE FOR PROCESSING.   DO NOT GIVE THEM TO YOUR DOCTOR.  PAIN CONTROL  First take acetaminophen (Tylenol) AND/or ibuprofen (Advil) to control your pain after surgery.  Follow directions on package.  Taking acetaminophen (Tylenol) and/or ibuprofen (Advil) regularly after surgery will help to control your pain and lower the amount of prescription pain medication you may need.  You should not take more than 3,000 mg (3 grams) of acetaminophen (Tylenol) in 24 hours.  You should not take ibuprofen (Advil), aleve, motrin, naprosyn or other NSAIDS if you have a history of stomach ulcers or chronic kidney disease.  A prescription for pain medication may be given to you upon discharge.  Take your pain medication as prescribed, if you still have uncontrolled pain after taking acetaminophen (Tylenol) or ibuprofen (Advil). Use ice packs to help control pain. If you need a refill on your pain medication, please contact your pharmacy.  They will contact our office to request authorization. Prescriptions will not be filled after 5pm or on week-ends.  HOME MEDICATIONS Take your usually prescribed medications unless otherwise directed.  DIET You should follow a light diet the first few days after arrival home.  Be sure to include lots of fluids daily. Avoid fatty, fried foods.   CONSTIPATION It is common to experience some constipation after surgery and if you are taking pain medication.  Increasing fluid intake and taking a stool softener (such as Colace) will usually help or prevent this problem from occurring.  A mild laxative (Milk of Magnesia or Miralax) should be taken according to package instructions if there are no bowel movements after 48  hours.  WOUND/INCISION CARE Most patients will experience some swelling and bruising in the area of the incisions.  Ice packs will help.  Swelling and bruising can take several days to resolve.  Unless discharge instructions indicate otherwise, follow guidelines below  STERI-STRIPS - you may remove your outer bandages 48 hours after surgery, and you may shower at that time.  You have steri-strips (small skin tapes) in place directly over the incision.  These strips should be left on the skin for 7-10 days.   DERMABOND/SKIN GLUE - you may shower in 24 hours.  The glue will flake off over the next 2-3 weeks. Any sutures or staples will be removed at the office during your follow-up visit.  ACTIVITIES You may resume regular (light) daily activities beginning the next day--such as daily self-care, walking, climbing stairs--gradually increasing activities as tolerated.  You may have sexual intercourse when it is comfortable.  Refrain from any heavy lifting or straining until approved by your doctor. You may drive when you are no longer taking prescription pain medication, you can comfortably wear a seatbelt, and you can safely maneuver your car and apply brakes.  FOLLOW-UP You should see your doctor in the office for a follow-up appointment approximately 2-3 weeks after your surgery.  You should have been given your post-op/follow-up appointment when your surgery was scheduled.  If you did not receive a post-op/follow-up appointment, make sure that you call for this appointment within a day or two after you arrive home to insure a convenient appointment time.   WHEN TO CALL YOUR DOCTOR: Fever over 101.0 Inability to urinate Continued bleeding from incision.   Increased pain, redness, or drainage from the incision. Increasing abdominal pain  The clinic staff is available to answer your questions during regular business hours.  Please don't hesitate to call and ask to speak to one of the nurses for  clinical concerns.  If you have a medical emergency, go to the nearest emergency room or call 911.  A surgeon from [facility] is always on call at the hospital. [address], [address], [place], [place]  [address] ? [address] [address], [place], [place]   [address] [phone] ? [phone] ? FAX [phone] Web site: [email or web address]  

## 2022-01-12 LAB — COMPREHENSIVE METABOLIC PANEL
ALT: 45 U/L — ABNORMAL HIGH (ref 0–44)
AST: 29 U/L (ref 15–41)
Albumin: 3.1 g/dL — ABNORMAL LOW (ref 3.5–5.0)
Alkaline Phosphatase: 50 U/L (ref 38–126)
Anion gap: 5 (ref 5–15)
BUN: 14 mg/dL (ref 6–20)
CO2: 26 mmol/L (ref 22–32)
Calcium: 8.1 mg/dL — ABNORMAL LOW (ref 8.9–10.3)
Chloride: 104 mmol/L (ref 98–111)
Creatinine, Ser: 1.1 mg/dL (ref 0.61–1.24)
GFR, Estimated: >60 mL/min (ref >60)
Glucose, Bld: 109 mg/dL — ABNORMAL HIGH (ref 70–99)
Potassium: 3.9 mmol/L (ref 3.5–5.1)
Sodium: 135 mmol/L (ref 135–145)
Total Bilirubin: 0.8 mg/dL (ref 0.3–1.2)
Total Protein: 6.7 g/dL (ref 6.5–8.1)

## 2022-01-12 LAB — CBC
HCT: 39.6 % (ref 39.0–52.0)
Hemoglobin: 12.9 g/dL — ABNORMAL LOW (ref 13.0–17.0)
MCH: 31.5 pg (ref 26.0–34.0)
MCHC: 32.6 g/dL (ref 30.0–36.0)
MCV: 96.6 fL (ref 80.0–100.0)
Platelets: 167 10*3/uL (ref 150–400)
RBC: 4.1 MIL/uL — ABNORMAL LOW (ref 4.22–5.81)
RDW: 12.5 % (ref 11.5–15.5)
WBC: 9.8 10*3/uL (ref 4.0–10.5)
nRBC: 0 % (ref 0.0–0.2)

## 2022-01-12 LAB — MAGNESIUM: Magnesium: 2.2 mg/dL (ref 1.7–2.4)

## 2022-01-12 LAB — SURGICAL PATHOLOGY

## 2022-01-12 MED ORDER — OXYCODONE HCL 5 MG PO TABS: 5.0000 mg | ORAL_TABLET | Freq: Four times a day (QID) | ORAL | 0 refills | Status: AC | PRN

## 2022-01-12 MED ORDER — ACETAMINOPHEN 500 MG PO TABS: 1000.0000 mg | ORAL_TABLET | Freq: Four times a day (QID) | ORAL | 0 refills | Status: AC | PRN

## 2022-01-12 MED ORDER — IBUPROFEN 200 MG PO TABS
400.0000 mg | ORAL_TABLET | Freq: Three times a day (TID) | ORAL | Status: AC | PRN
Start: 2022-01-12 — End: ?

## 2022-01-12 NOTE — Progress Notes (Signed)
Pt alert and oriented, tolerating diet. Instructions given for drain. D/C instructions given, pt d/cd to home.

## 2022-01-12 NOTE — Discharge Summary (Signed)
[facility] Discharge Summary   Patient ID: Alejandro Crawford MRN: [id number] DOB/AGE: 1976-07-31 46 y.o.  Admit date: 01/09/2022 Discharge date: 01/12/2022  Admitting Diagnosis: Cholecystitis   Discharge Diagnosis Patient Active Problem List   Diagnosis Date Noted   Cholecystitis with cholelithiasis 01/11/2022   Acute cholecystitis 01/09/2022    Consultants None   Procedures Dr. Phylliss Blakes (01/10/22) - Laparoscopic Cholecystectomy, drain placement  Hospital Course:  46 year old male who presented to the emergency department with less than 24 hours of epigastric abdominal pain.  Work-up significant for cholecystitis.  Patient was admitted and underwent procedure listed above where his gallbladder was noted to be gangrenous, and a drain was placed.  Tolerated procedure well and was transferred to the floor.  Diet was advanced as tolerated.  On POD #2, the patient was voiding well, tolerating diet, ambulating well, pain well controlled, vital signs stable, incisions c/d/i and felt stable for discharge home.  Postoperatively the drain remained serosanguineous, with no evidence of bile leak.  Drain was left in place at the time of discharge.  Patient will follow up in our office as below.    I have personally reviewed the patients medication history on the [place] controlled substance database.  Physical Exam: General:  Alert, NAD, pleasant, comfortable Abd:  Soft, nondistended, appropriately tender, incisions C/D/I, drain in place with serosanguineous output.  Allergies as of 01/12/2022   No Known Allergies      Medication List     TAKE these medications    acetaminophen 500 MG tablet Commonly known as: TYLENOL Take 2 tablets (1,000 mg total) by mouth every 6 (six) hours as needed for mild pain or fever.   Avonex Pen 30 MCG/0.5ML Ajkt Generic drug: Interferon Beta-1a Inject 0.5 mLs into the muscle once a week.   cholecalciferol 25 MCG (1000 UNIT) tablet Commonly  known as: VITAMIN D3 Take 1,000 Units by mouth daily.   Fish Oil 1000 MG Caps Take 1 capsule by mouth daily.   ibuprofen 200 MG tablet Commonly known as: ADVIL Take 2-3 tablets (400-600 mg total) by mouth every 8 (eight) hours as needed for mild pain or moderate pain.   Multivitamin Adult Tabs Take 1 tablet by mouth daily.   oxyCODONE 5 MG immediate release tablet Commonly known as: Oxy IR/ROXICODONE Take 1 tablet (5 mg total) by mouth every 6 (six) hours as needed for moderate pain or severe pain (pain not releived by tylenol or advil).   Turmeric 500 MG Caps Take 1 capsule by mouth daily.   Wegovy 0.25 MG/0.5ML Soaj Generic drug: Semaglutide-Weight Management Inject 0.25 mg into the skin once a week.          Follow-up Information     Hedda Slade, PA-C Follow up on 01/25/2022.   Specialty: General Surgery Why: at 1:45 PM For post-operative follow up. please arrive 15 minutes early. Contact information: [address] [place] [address] [phone]         Surgery, [place]. Go on 01/19/2022.   Specialty: General Surgery Why: at 10:00 AM for a nurses visit for drain removal. please arrive 20-30 minutes early to get checked in a fill out any necessary paperwork. Contact information: [address] [place] [address] [phone]                 Signed: Hosie Spangle, [facility] 01/12/2022, 10:46 AM

## 2022-01-31 DIAGNOSIS — Z09 Encounter for follow-up examination after completed treatment for conditions other than malignant neoplasm: Secondary | ICD-10-CM | POA: Diagnosis not present

## 2022-01-31 DIAGNOSIS — Z6841 Body Mass Index (BMI) 40.0 and over, adult: Secondary | ICD-10-CM | POA: Diagnosis not present

## 2022-01-31 DIAGNOSIS — G35 Multiple sclerosis: Secondary | ICD-10-CM | POA: Diagnosis not present

## 2022-02-02 DIAGNOSIS — F4322 Adjustment disorder with anxiety: Secondary | ICD-10-CM | POA: Diagnosis not present

## 2022-02-23 DIAGNOSIS — F4322 Adjustment disorder with anxiety: Secondary | ICD-10-CM | POA: Diagnosis not present

## 2022-03-30 DIAGNOSIS — F4322 Adjustment disorder with anxiety: Secondary | ICD-10-CM | POA: Diagnosis not present

## 2022-05-04 DIAGNOSIS — F4322 Adjustment disorder with anxiety: Secondary | ICD-10-CM | POA: Diagnosis not present

## 2022-06-08 DIAGNOSIS — F4322 Adjustment disorder with anxiety: Secondary | ICD-10-CM | POA: Diagnosis not present

## 2022-07-13 DIAGNOSIS — F4322 Adjustment disorder with anxiety: Secondary | ICD-10-CM | POA: Diagnosis not present

## 2022-07-26 DIAGNOSIS — U071 COVID-19: Secondary | ICD-10-CM | POA: Diagnosis not present

## 2022-12-27 ENCOUNTER — Other Ambulatory Visit: Payer: Self-pay | Admitting: Family Medicine

## 2022-12-27 DIAGNOSIS — R1011 Right upper quadrant pain: Secondary | ICD-10-CM

## 2023-01-02 ENCOUNTER — Other Ambulatory Visit: Payer: BC Managed Care – PPO

## 2023-01-02 ENCOUNTER — Ambulatory Visit
Admission: RE | Admit: 2023-01-02 | Discharge: 2023-01-02 | Disposition: A | Discharge status: Home / Self Care | Payer: Self-pay | Source: Ambulatory Visit | Attending: Family Medicine | Admitting: Family Medicine

## 2023-01-02 DIAGNOSIS — R1011 Right upper quadrant pain: Secondary | ICD-10-CM

## 2023-05-27 IMAGING — CT CT ABD-PELV W/ CM
2 of 5 series · 16 of 46 positions shown, 18 images · IV contrast (agent unspecified)
Comparison: None.

CLINICAL DATA: 45-year-old male with acute abdominal and pelvic
pain with vomiting.

EXAM:
CT ABDOMEN AND PELVIS WITH CONTRAST
TECHNIQUE: Multidetector CT imaging of the abdomen and pelvis was performed
using the standard protocol following bolus administration of
intravenous contrast.

[Series 2: axial st · axial · 0.79mm/px · z∈[-463,-48]mm · 13 of 97 slices shown, 15 images]
[im 7/97  soft-tissue]
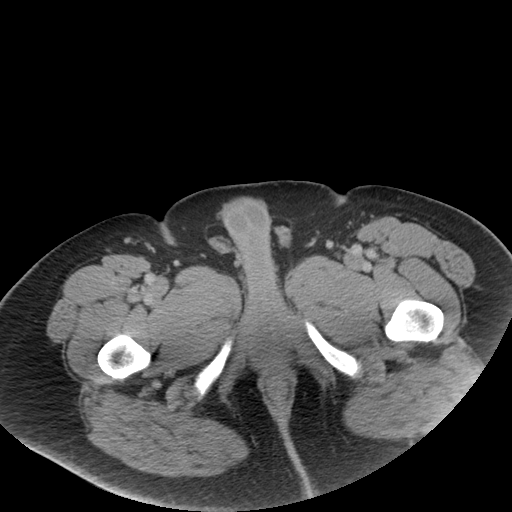
[im 7/97  bone]
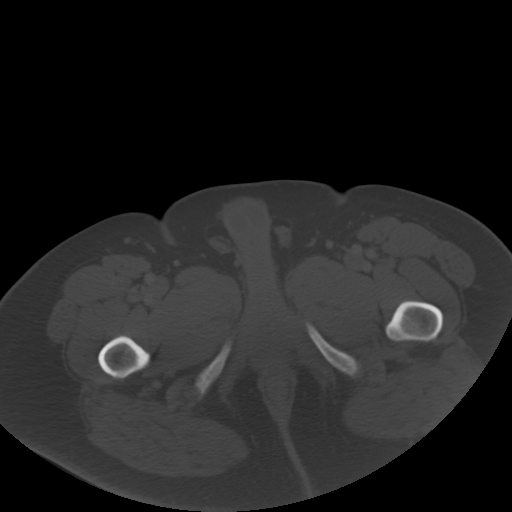
[im 13/97  soft-tissue]
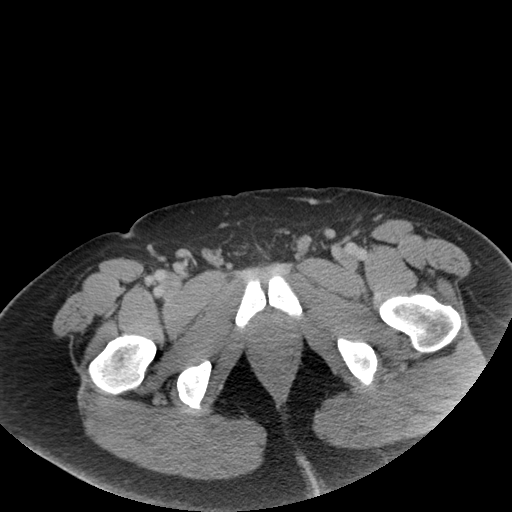
[im 20/97  soft-tissue]
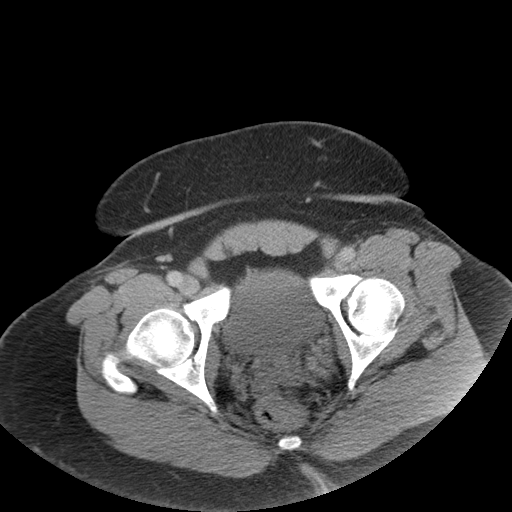
[im 26/97  soft-tissue]
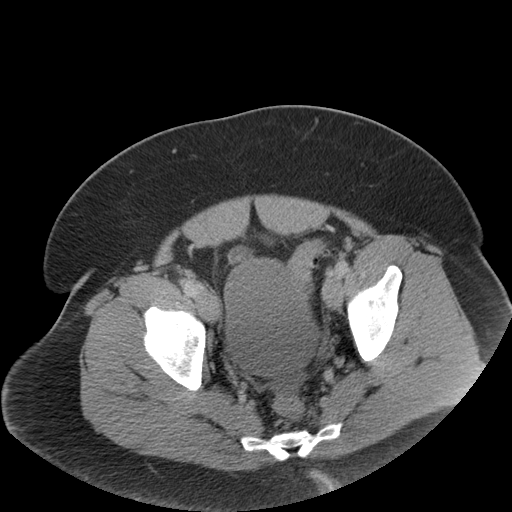
[im 33/97  soft-tissue]
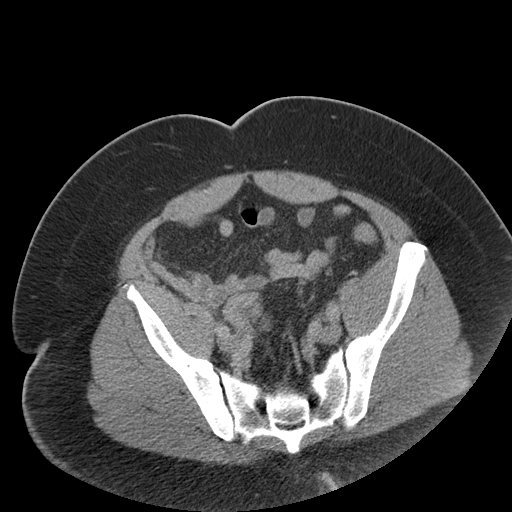
[im 39/97  soft-tissue]
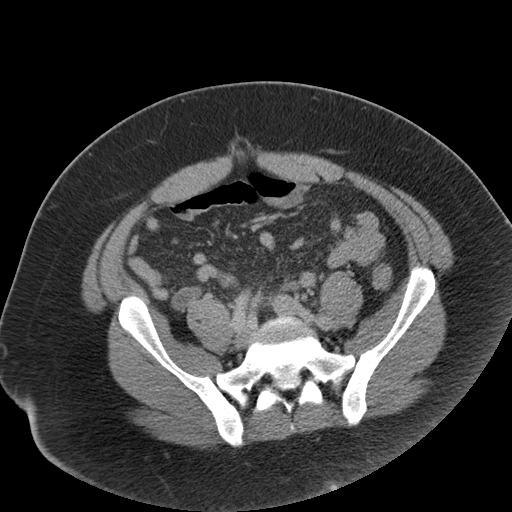
[im 52/97  soft-tissue]
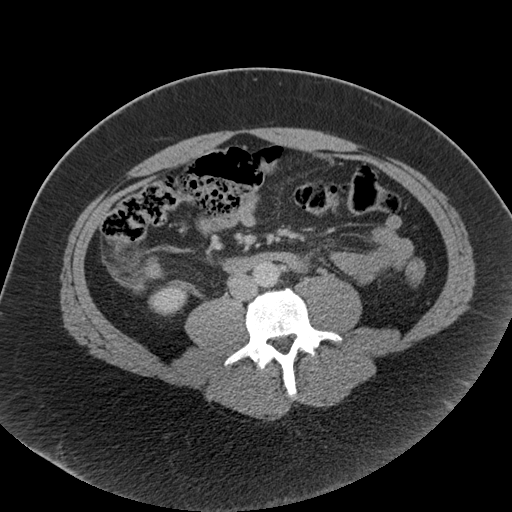
[im 58/97  soft-tissue]
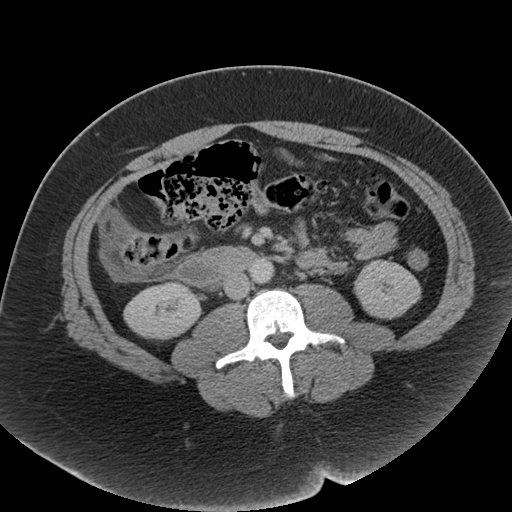
[im 65/97  soft-tissue]
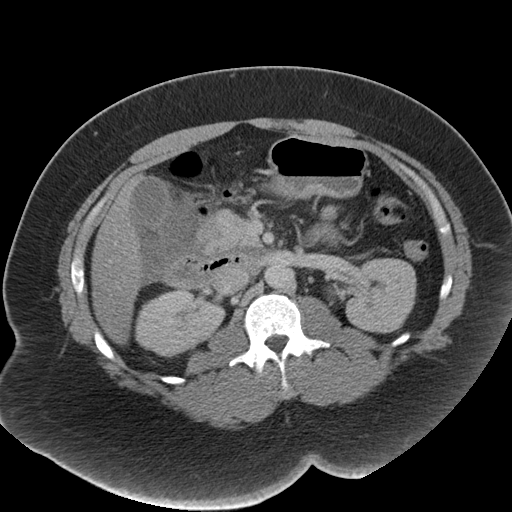
[im 65/97  bone]
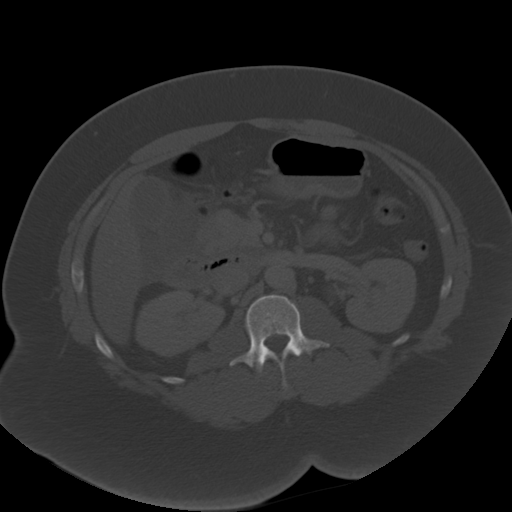
[im 71/97  soft-tissue]
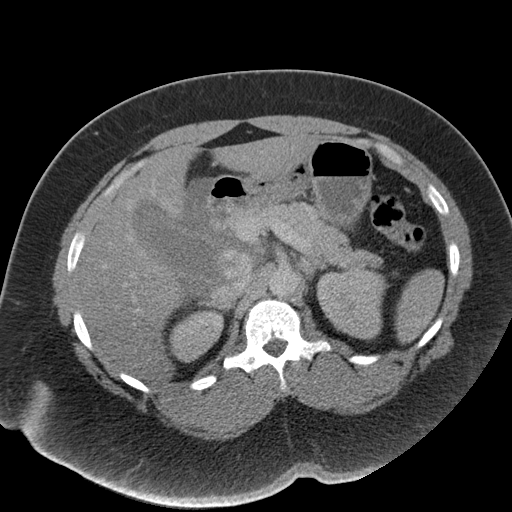
[im 77/97  soft-tissue]
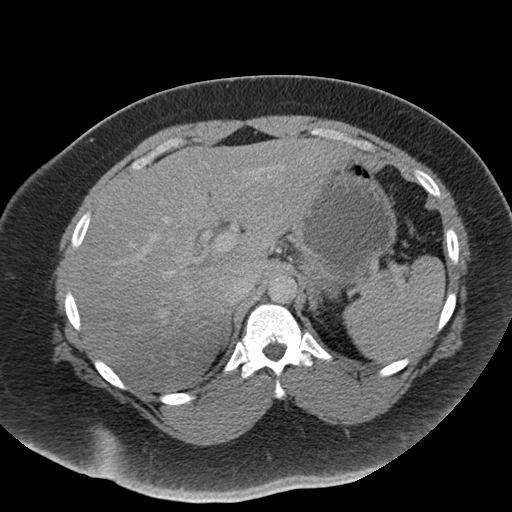
[im 84/97  soft-tissue]
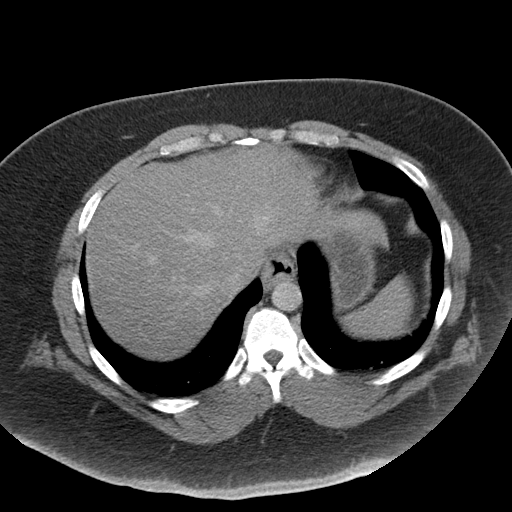
[im 90/97  soft-tissue]
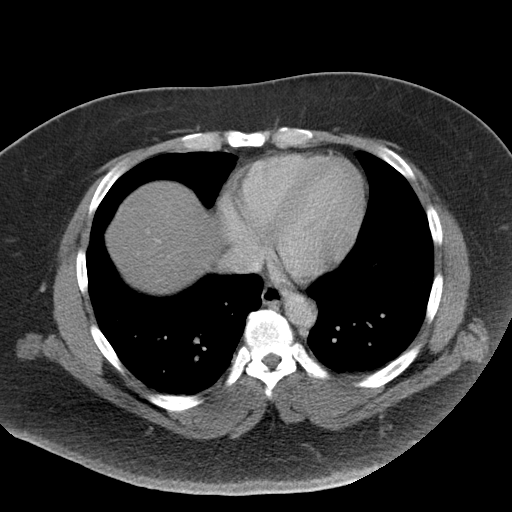

[Series 4: coronal st · coronal · 0.84mm/px · 3 of 175 slices shown]
[im 59/175  soft-tissue]
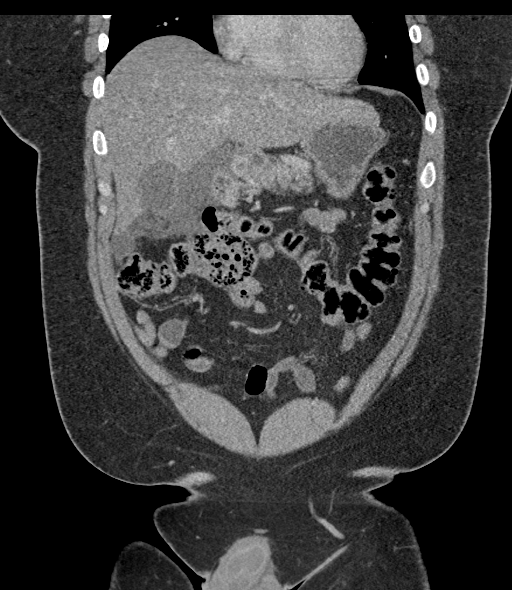
[im 78/175  soft-tissue]
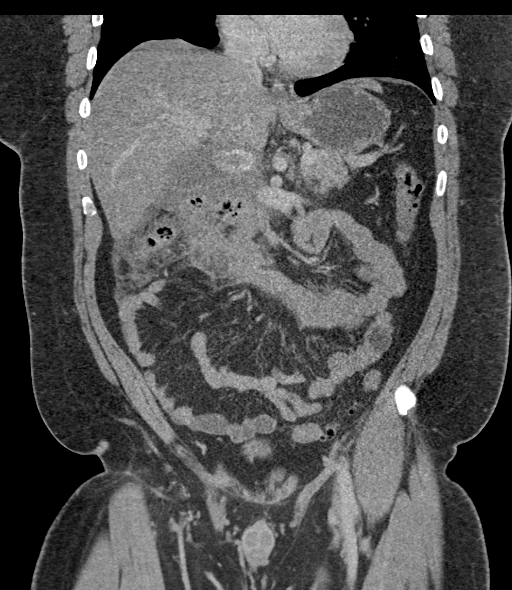
[im 97/175  soft-tissue]
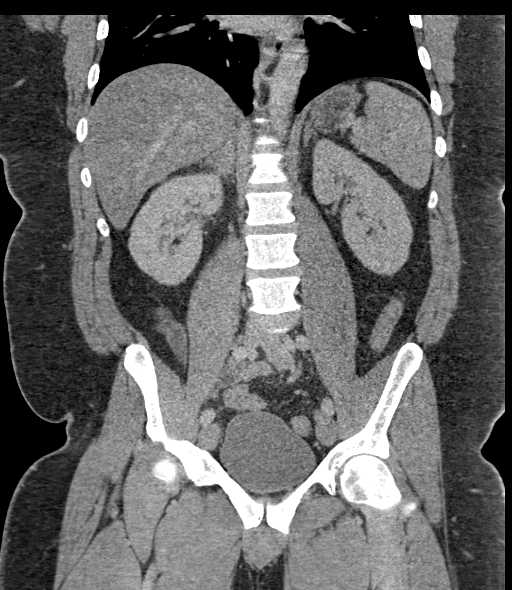

[16 of 46 positions shown; findings below may reference images not displayed]

RADIATION DOSE REDUCTION: This exam was performed according to the
departmental dose-optimization program which includes automated
exposure control, adjustment of the mA and/or kV according to
patient size and/or use of iterative reconstruction technique.

CONTRAST:  100mL OMNIPAQUE IOHEXOL 300 MG/ML  SOLN
FINDINGS: Lower chest: No acute abnormality.

Hepatobiliary: A 3.5 cm gallstone is noted in the region of the
gallbladder neck with moderate pericholecystic fluid/inflammation
compatible with acute cholecystitis.

Hepatic steatosis is noted

No definite CBD for intrahepatic biliary dilatation identified.

Pancreas: Unremarkable

Spleen: Unremarkable

Adrenals/Urinary Tract: The kidneys, adrenal glands and bladder are
unremarkable.

Stomach/Bowel: Stomach is within normal limits. Appendix appears
normal. No evidence of bowel wall thickening, distention, or
inflammatory changes.

Vascular/Lymphatic: No significant vascular findings are present. No
enlarged abdominal or pelvic lymph nodes.

Reproductive: Prostate is unremarkable.

Other: A trace amount free fluid within the pelvis is noted. There
is no evidence of pneumoperitoneum or abscess.

Musculoskeletal: No acute or suspicious bony abnormalities are
noted.
IMPRESSION: 1. 3.5 cm gallstone in the region of the gallbladder neck with
moderate pericholecystic fluid/inflammation compatible with acute
cholecystitis. No definite biliary dilatation.
2. Hepatic steatosis.
